# Patient Record
Sex: Female | Born: 1980 | Race: Black or African American | Hispanic: No | Marital: Single | Smoking: Former smoker
Health system: Southern US, Community
[De-identification: ages and names within clinical notes are randomized; demographics above are authoritative.]

## PROBLEM LIST (undated history)

## (undated) DIAGNOSIS — Z803 Family history of malignant neoplasm of breast: Secondary | ICD-10-CM

## (undated) DIAGNOSIS — A64 Unspecified sexually transmitted disease: Secondary | ICD-10-CM

## (undated) DIAGNOSIS — Z5189 Encounter for other specified aftercare: Secondary | ICD-10-CM

## (undated) DIAGNOSIS — Z8481 Family history of carrier of genetic disease: Secondary | ICD-10-CM

## (undated) HISTORY — DX: Encounter for other specified aftercare: Z51.89

## (undated) HISTORY — DX: Family history of malignant neoplasm of breast: Z80.3

## (undated) HISTORY — DX: Family history of carrier of genetic disease: Z84.81

## (undated) HISTORY — DX: Unspecified sexually transmitted disease: A64

## (undated) HISTORY — PX: WRIST SURGERY: SHX841

---

## 2000-09-06 ENCOUNTER — Emergency Department (HOSPITAL_COMMUNITY): Admission: EM | Admit: 2000-09-06 | Discharge: 2000-09-06 | Payer: Self-pay | Admitting: Emergency Medicine

## 2000-09-26 ENCOUNTER — Emergency Department (HOSPITAL_COMMUNITY): Admission: EM | Admit: 2000-09-26 | Discharge: 2000-09-26 | Payer: Self-pay | Admitting: Emergency Medicine

## 2000-09-26 ENCOUNTER — Encounter: Payer: Self-pay | Admitting: Emergency Medicine

## 2004-11-20 ENCOUNTER — Emergency Department (HOSPITAL_COMMUNITY): Admission: EM | Admit: 2004-11-20 | Discharge: 2004-11-20 | Payer: Self-pay | Admitting: Emergency Medicine

## 2010-06-14 DIAGNOSIS — Z5189 Encounter for other specified aftercare: Secondary | ICD-10-CM

## 2010-06-14 HISTORY — DX: Encounter for other specified aftercare: Z51.89

## 2010-07-11 DIAGNOSIS — O1490 Unspecified pre-eclampsia, unspecified trimester: Secondary | ICD-10-CM

## 2013-05-30 ENCOUNTER — Emergency Department (HOSPITAL_COMMUNITY)
Admission: EM | Admit: 2013-05-30 | Discharge: 2013-05-30 | Disposition: A | Discharge status: Home / Self Care | Payer: Self-pay | Attending: Emergency Medicine | Admitting: Emergency Medicine

## 2013-05-30 ENCOUNTER — Encounter (HOSPITAL_COMMUNITY): Payer: Self-pay | Admitting: Emergency Medicine

## 2013-05-30 ENCOUNTER — Telehealth (HOSPITAL_COMMUNITY): Payer: Self-pay | Admitting: *Deleted

## 2013-05-30 DIAGNOSIS — T24219A Burn of second degree of unspecified thigh, initial encounter: Secondary | ICD-10-CM | POA: Insufficient documentation

## 2013-05-30 DIAGNOSIS — Y93G9 Activity, other involving cooking and grilling: Secondary | ICD-10-CM | POA: Insufficient documentation

## 2013-05-30 DIAGNOSIS — T2122XA Burn of second degree of abdominal wall, initial encounter: Secondary | ICD-10-CM | POA: Insufficient documentation

## 2013-05-30 DIAGNOSIS — F17200 Nicotine dependence, unspecified, uncomplicated: Secondary | ICD-10-CM | POA: Insufficient documentation

## 2013-05-30 DIAGNOSIS — X12XXXA Contact with other hot fluids, initial encounter: Secondary | ICD-10-CM | POA: Insufficient documentation

## 2013-05-30 DIAGNOSIS — X131XXA Other contact with steam and other hot vapors, initial encounter: Secondary | ICD-10-CM

## 2013-05-30 DIAGNOSIS — Z23 Encounter for immunization: Secondary | ICD-10-CM | POA: Insufficient documentation

## 2013-05-30 DIAGNOSIS — Y929 Unspecified place or not applicable: Secondary | ICD-10-CM | POA: Insufficient documentation

## 2013-05-30 DIAGNOSIS — IMO0002 Reserved for concepts with insufficient information to code with codable children: Secondary | ICD-10-CM

## 2013-05-30 MED ORDER — FENTANYL CITRATE 0.05 MG/ML IJ SOLN
100.0000 ug | Freq: Once | INTRAMUSCULAR | Status: AC
Start: 2013-05-30 — End: 2013-05-30
  Administered 2013-05-30: 100 ug via INTRAVENOUS

## 2013-05-30 MED ORDER — TETANUS-DIPHTH-ACELL PERTUSSIS 5-2.5-18.5 LF-MCG/0.5 IM SUSP
0.5000 mL | Freq: Once | INTRAMUSCULAR | Status: AC
  Administered 2013-05-30: 0.5 mL via INTRAMUSCULAR

## 2013-05-30 MED ORDER — FENTANYL CITRATE 0.05 MG/ML IJ SOLN
INTRAMUSCULAR | Status: AC
  Filled 2013-05-30: qty 2

## 2013-05-30 MED ORDER — SILVER SULFADIAZINE 1 % EX CREA: 1.0000 "application " | TOPICAL_CREAM | Freq: Every day | CUTANEOUS | Status: DC

## 2013-05-30 MED ORDER — SILVER SULFADIAZINE 1 % EX CREA
TOPICAL_CREAM | Freq: Once | CUTANEOUS | Status: AC
  Administered 2013-05-30: 21:00:00 via TOPICAL
  Filled 2013-05-30: qty 85

## 2013-05-30 MED ORDER — TETANUS-DIPHTH-ACELL PERTUSSIS 5-2.5-18.5 LF-MCG/0.5 IM SUSP
INTRAMUSCULAR | Status: AC
  Filled 2013-05-30: qty 0.5

## 2013-05-30 MED ORDER — OXYCODONE-ACETAMINOPHEN 10-325 MG PO TABS: 1.0000 | ORAL_TABLET | ORAL | Status: DC | PRN

## 2013-05-30 MED ORDER — FENTANYL CITRATE 0.05 MG/ML IJ SOLN
INTRAMUSCULAR | Status: AC
  Administered 2013-05-30: 100 ug via INTRAVENOUS
  Filled 2013-05-30: qty 2
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
## 2013-05-30 NOTE — ED Notes (Signed)
pt calling wanting to know if we would allow here to get the pain meds without the silvidine cream at this time because she has a tube of silvidine cream we gave her with her visit.  I told her to have the pharmacy call me directly.

## 2013-05-30 NOTE — Discharge Instructions (Signed)

## 2013-06-05 ENCOUNTER — Encounter (HOSPITAL_COMMUNITY): Payer: Self-pay | Admitting: Emergency Medicine

## 2013-06-05 ENCOUNTER — Emergency Department (HOSPITAL_COMMUNITY)
Admission: EM | Admit: 2013-06-05 | Discharge: 2013-06-05 | Disposition: A | Discharge status: Home / Self Care | Payer: Self-pay | Attending: Emergency Medicine | Admitting: Emergency Medicine

## 2013-06-05 DIAGNOSIS — Z792 Long term (current) use of antibiotics: Secondary | ICD-10-CM | POA: Insufficient documentation

## 2013-06-05 DIAGNOSIS — T2122XA Burn of second degree of abdominal wall, initial encounter: Secondary | ICD-10-CM | POA: Insufficient documentation

## 2013-06-05 DIAGNOSIS — N61 Mastitis without abscess: Secondary | ICD-10-CM | POA: Insufficient documentation

## 2013-06-05 DIAGNOSIS — X131XXA Other contact with steam and other hot vapors, initial encounter: Secondary | ICD-10-CM

## 2013-06-05 DIAGNOSIS — X12XXXA Contact with other hot fluids, initial encounter: Secondary | ICD-10-CM | POA: Insufficient documentation

## 2013-06-05 DIAGNOSIS — Z975 Presence of (intrauterine) contraceptive device: Secondary | ICD-10-CM | POA: Insufficient documentation

## 2013-06-05 DIAGNOSIS — T2101XA Burn of unspecified degree of chest wall, initial encounter: Secondary | ICD-10-CM | POA: Insufficient documentation

## 2013-06-05 DIAGNOSIS — Y929 Unspecified place or not applicable: Secondary | ICD-10-CM | POA: Insufficient documentation

## 2013-06-05 DIAGNOSIS — Y9389 Activity, other specified: Secondary | ICD-10-CM | POA: Insufficient documentation

## 2013-06-05 DIAGNOSIS — T24219A Burn of second degree of unspecified thigh, initial encounter: Secondary | ICD-10-CM | POA: Insufficient documentation

## 2013-06-05 DIAGNOSIS — F17200 Nicotine dependence, unspecified, uncomplicated: Secondary | ICD-10-CM | POA: Insufficient documentation

## 2013-06-05 DIAGNOSIS — Z5189 Encounter for other specified aftercare: Secondary | ICD-10-CM

## 2013-06-05 MED ORDER — SULFAMETHOXAZOLE-TRIMETHOPRIM 800-160 MG PO TABS: 1.0000 | ORAL_TABLET | Freq: Two times a day (BID) | ORAL | Status: DC

## 2013-06-05 NOTE — Progress Notes (Signed)
                  Dear __________ Marisa HerbLetraea Williams _________________:  Marisa QuinYou have been approved to have your discharge prescriptions filled through our [facility] ([facility]) program. This program allows for a one-time (no refills) 34-day supply of selected medications for a low copay amount.  The copay is $3.00 per prescription. For instance, if you have one prescription, you will pay $3.00; for two prescriptions, you pay $6.00; for three prescriptions, you pay $9.00; and so on.  Only certain pharmacies are participating in this program with [facility]. You will need to select one of the pharmacies from the attached list and take your prescriptions, this letter, and your photo ID to one of the participating pharmacies.   We are excited that you are able to use the [facility] program to get your medications. These prescriptions must be filled within 7 days of hospital discharge or they will no longer be valid for the [facility] program. Should you have any problems with your prescriptions please contact your case management team member at [phone].  Thank you,   [facility]   Participating [facility] Pharmacies  [facility]   [facility] [address] [place], [place]   [facility] [address] [place], [place]   [facility] [address] [place], [place]   [facility]   [address], [place], [place]   [id number], [place], [place]   [address], [place], [place]   [id number] [address], [place], [place]   [id number], [place], [place]   [id number] [address], [place], [place]   [address], [place], [place]   [address], [place], [place]   [address], [place], [place]   [id number], [place], [place]  [facility]   [address], [place], [place]   [id number] [place],  [place]   [id number], [place], [place]   [id number] [address], [place], [place]   [address], [place], [place]   [id number] [place] [address], [place], [place] [facility]   [address], [place], [place]   [address], [place], [place]   [id number] [address], [place], [place]   [address], [place], [place]   [address], [place], [place]   [address], [place], [place]   [address], [place], [place]   [address], [place], [place]   [id number] [address], [place], [place]   [address], [place], [place]   [address], [place], [place]   [address], [place], [place]   [address], [place], [place]   [id number] [place], [place], [place]  Independent Pharmacies   [facility] [address], [address] [place], [place]   [facility] [address] [place], [place]   [facility] [address] [place], [place]   For continued medication needs, please contact the [facility] at  [phone].
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
## 2013-06-05 NOTE — ED Provider Notes (Signed)
Medical screening examination/treatment/procedure(s) were performed by non-physician practitioner and as supervising physician I was immediately available for consultation/collaboration.  EKG Interpretation   None         Glynn OctaveStephen Elzia Hott, MD 06/05/13 [phone]

## 2013-06-05 NOTE — Discharge Instructions (Signed)
Continue applying silvadene cream and changing dressings daily. Take prescribed antibiotics as directed. Return to the ED for new or worsening symptoms-- high fever, nipple discharge, abscess formation, etc.
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
## 2013-08-24 ENCOUNTER — Emergency Department (INDEPENDENT_AMBULATORY_CARE_PROVIDER_SITE_OTHER)
Admission: EM | Admit: 2013-08-24 | Discharge: 2013-08-24 | Disposition: A | Discharge status: Home / Self Care | Payer: Self-pay | Source: Home / Self Care | Attending: Emergency Medicine | Admitting: Emergency Medicine

## 2013-08-24 ENCOUNTER — Encounter (HOSPITAL_COMMUNITY): Payer: Self-pay | Admitting: Emergency Medicine

## 2013-08-24 DIAGNOSIS — J329 Chronic sinusitis, unspecified: Secondary | ICD-10-CM

## 2013-08-24 DIAGNOSIS — J029 Acute pharyngitis, unspecified: Secondary | ICD-10-CM

## 2013-08-24 DIAGNOSIS — R0982 Postnasal drip: Secondary | ICD-10-CM

## 2013-08-24 LAB — POCT RAPID STREP A: STREPTOCOCCUS, GROUP A SCREEN (DIRECT): NEGATIVE

## 2013-08-24 MED ORDER — PREDNISONE 10 MG PO TABS: ORAL_TABLET | ORAL | Status: DC

## 2013-08-24 MED ORDER — FLUTICASONE PROPIONATE 50 MCG/ACT NA SUSP: 2.0000 | Freq: Two times a day (BID) | NASAL | Status: DC

## 2013-08-24 NOTE — ED Provider Notes (Signed)
CSN: [id number]     Arrival date & time 08/24/13  0809 History   First MD Initiated Contact with Patient 08/24/13 0825     Chief Complaint  Patient presents with  . Sore Throat   (Consider location/radiation/quality/duration/timing/severity/associated sxs/prior Treatment) HPI Comments: 89f presents c/o sore throat for a week, also some sinus headache and ear pressure.  She has tried multiple OTCs with minimal relief but problem not resolving.  No fever, chills, NVD, cough, SOB.  Hx of "sinus allergies"   Patient is a 33 y.o. female presenting with pharyngitis.  Sore Throat    History reviewed. No pertinent past medical history. Past Surgical History  Procedure Laterality Date  . Cesarean section     History reviewed. No pertinent family history. History  Substance Use Topics  . Smoking status: Current Every Day Smoker -- 0.25 packs/day    Types: Cigarettes  . Smokeless tobacco: Not on file  . Alcohol Use: Yes   OB History   Grav Para Term Preterm Abortions TAB SAB Ect Mult Living                 Review of Systems  HENT: Positive for congestion, ear pain (pressure) and sore throat.   All other systems reviewed and are negative.   Allergies  Review of patient's allergies indicates no known allergies.  Home Medications   Current Outpatient Rx  Name  Route  Sig  Dispense  Refill  . fluticasone (FLONASE) 50 MCG/ACT nasal spray   Each Nare   Place 2 sprays into both nostrils 2 (two) times daily. Decrease to 2 sprays/nostril daily after 5 days   16 g   2   . levonorgestrel (MIRENA) 20 MCG/24HR IUD   Intrauterine   1 each by Intrauterine route once. Implanted April 2012         . neomycin-bacitracin-polymyxin (NEOSPORIN) OINT   Topical   Apply 1 application topically as needed for wound care.         Marland Kitchen. oxyCODONE-acetaminophen (PERCOCET) 10-325 MG per tablet   Oral   Take 1 tablet by mouth every 4 (four) hours as needed for pain.   30 tablet   0   .  predniSONE (DELTASONE) 10 MG tablet      4 tabs PO QD for 4 days;  3 tabs PO QD for 3 days;  2 tabs PO QD for 2 days;  1 tab PO QD for 1 day   30 tablet   0   . silver sulfADIAZINE (SILVADENE) 1 % cream   Topical   Apply 1 application topically daily.   50 g   0   . sulfamethoxazole-trimethoprim (SEPTRA DS) 800-160 MG per tablet   Oral   Take 1 tablet by mouth every 12 (twelve) hours.   20 tablet   0    BP 126/78  Pulse 76  Temp(Src) 98.1 F (36.7 C) (Oral)  Resp 16  SpO2 100%  LMP 08/14/2013 Physical Exam  Nursing note and vitals reviewed. Constitutional: She is oriented to person, place, and time. Vital signs are normal. She appears well-developed and well-nourished. No distress.  HENT:  Head: Normocephalic and atraumatic.  Right Ear: External ear normal.  Left Ear: External ear normal.  Nose: Nose normal.  Mouth/Throat: Posterior oropharyngeal erythema (mild) present. No oropharyngeal exudate.  Cobblestoning posterior oropharynx  Eyes: Conjunctivae are normal. Right eye exhibits no discharge. Left eye exhibits no discharge.  Neck: Normal range of motion. Neck supple.  Cardiovascular: Normal  rate, regular rhythm and normal heart sounds.   Pulmonary/Chest: Effort normal and breath sounds normal. No respiratory distress.  Lymphadenopathy:    She has no cervical adenopathy.  Neurological: She is alert and oriented to person, place, and time. She has normal strength. Coordination normal.  Skin: Skin is warm and dry. No rash noted. She is not diaphoretic.  Psychiatric: She has a normal mood and affect. Judgment normal.    ED Course  Procedures (including critical care time) Labs Review Labs Reviewed  POCT RAPID STREP A ([facility] URG CARE ONLY)   Imaging Review No results found.   MDM   1. Post-nasal drainage   2. Pharyngitis     Sore throat most likely being caused by post nasal drip.  Treat for allergies and allergic sinusitis, should resolve the problem.  F/u  PRN   Meds ordered this encounter  Medications  . fluticasone (FLONASE) 50 MCG/ACT nasal spray    Sig: Place 2 sprays into both nostrils 2 (two) times daily. Decrease to 2 sprays/nostril daily after 5 days    Dispense:  16 g    Refill:  2    Order Specific Question:  Supervising Provider    Answer:  Lorenz CoasterKELLER, DAVID C [id number]]  . predniSONE (DELTASONE) 10 MG tablet    Sig: 4 tabs PO QD for 4 days;  3 tabs PO QD for 3 days;  2 tabs PO QD for 2 days;  1 tab PO QD for 1 day    Dispense:  30 tablet    Refill:  0    Order Specific Question:  Supervising Provider    Answer:  Lorenz CoasterKELLER, DAVID C [[id number]]   Take advil allergy sinus as well     Graylon GoodZachary H Keatin Benham, PA-C 08/24/13 0848

## 2013-08-24 NOTE — ED Notes (Signed)
C/o  Sore throat x 1 wk.  Pain with swallowing. Mild swelling.  Gradually getting worse.  Symptoms worse at night.  Denies fever, n/v/d.  Mild relief with otc meds.

## 2013-08-24 NOTE — ED Provider Notes (Signed)
Medical screening examination/treatment/procedure(s) were performed by non-physician practitioner and as supervising physician I was immediately available for consultation/collaboration.  Leslee Homeavid Heer Justiss, M.D.  Reuben Likesavid C Tequisha Maahs, MD 08/24/13 [phone]

## 2013-08-24 NOTE — Discharge Instructions (Signed)
Pharyngitis °Pharyngitis is redness, pain, and swelling (inflammation) of your pharynx.  °CAUSES  °Pharyngitis is usually caused by infection. Most of the time, these infections are from viruses (viral) and are part of a cold. However, sometimes pharyngitis is caused by bacteria (bacterial). Pharyngitis can also be caused by allergies. Viral pharyngitis may be spread from person to person by coughing, sneezing, and personal items or utensils (cups, forks, spoons, toothbrushes). Bacterial pharyngitis may be spread from person to person by more intimate contact, such as kissing.  °SIGNS AND SYMPTOMS  °Symptoms of pharyngitis include:   °· Sore throat.   °· Tiredness (fatigue).   °· Low-grade fever.   °· Headache. °· Joint pain and muscle aches. °· Skin rashes. °· Swollen lymph nodes. °· Plaque-like film on throat or tonsils (often seen with bacterial pharyngitis). °DIAGNOSIS  °Your health care provider will ask you questions about your illness and your symptoms. Your medical history, along with a physical exam, is often all that is needed to diagnose pharyngitis. Sometimes, a rapid strep test is done. Other lab tests may also be done, depending on the suspected cause.  °TREATMENT  °Viral pharyngitis will usually get better in 3 4 days without the use of medicine. Bacterial pharyngitis is treated with medicines that kill germs (antibiotics).  °HOME CARE INSTRUCTIONS  °· Drink enough water and fluids to keep your urine clear or pale yellow.   °· Only take over-the-counter or prescription medicines as directed by your health care provider:   °· If you are prescribed antibiotics, make sure you finish them even if you start to feel better.   °· Do not take aspirin.   °· Get lots of rest.   °· Gargle with 8 oz of salt water (½ tsp of salt per 1 qt of water) as often as every 1 2 hours to soothe your throat.   °· Throat lozenges (if you are not at risk for choking) or sprays may be used to soothe your throat. °SEEK MEDICAL  CARE IF:  °· You have large, tender lumps in your neck. °· You have a rash. °· You cough up green, yellow-brown, or bloody spit. °SEEK IMMEDIATE MEDICAL CARE IF:  °· Your neck becomes stiff. °· You drool or are unable to swallow liquids. °· You vomit or are unable to keep medicines or liquids down. °· You have severe pain that does not go away with the use of recommended medicines. °· You have trouble breathing (not caused by a stuffy nose). °MAKE SURE YOU:  °· Understand these instructions. °· Will watch your condition. °· Will get help right away if you are not doing well or get worse. °Document Released: 04/30/2005 Document Revised: 02/18/2013 Document Reviewed: 01/05/2013 °ExitCare® Patient Information ©2014 [employer]. ° °

## 2013-08-26 LAB — CULTURE, GROUP A STREP

## 2013-12-01 ENCOUNTER — Emergency Department (HOSPITAL_COMMUNITY)
Admission: EM | Admit: 2013-12-01 | Discharge: 2013-12-01 | Disposition: A | Discharge status: Home / Self Care | Payer: Self-pay | Attending: Emergency Medicine | Admitting: Emergency Medicine

## 2013-12-01 ENCOUNTER — Encounter (HOSPITAL_COMMUNITY): Payer: Self-pay | Admitting: Emergency Medicine

## 2013-12-01 DIAGNOSIS — H00019 Hordeolum externum unspecified eye, unspecified eyelid: Secondary | ICD-10-CM | POA: Insufficient documentation

## 2013-12-01 DIAGNOSIS — H00016 Hordeolum externum left eye, unspecified eyelid: Secondary | ICD-10-CM

## 2013-12-01 DIAGNOSIS — F17200 Nicotine dependence, unspecified, uncomplicated: Secondary | ICD-10-CM | POA: Insufficient documentation

## 2013-12-01 DIAGNOSIS — Z79899 Other long term (current) drug therapy: Secondary | ICD-10-CM | POA: Insufficient documentation

## 2013-12-01 MED ORDER — ERYTHROMYCIN 5 MG/GM OP OINT: TOPICAL_OINTMENT | OPHTHALMIC | Status: DC

## 2013-12-01 MED ORDER — FLUORESCEIN SODIUM 1 MG OP STRP
1.0000 | ORAL_STRIP | Freq: Once | OPHTHALMIC | Status: AC
  Administered 2013-12-01: 1 via OPHTHALMIC
  Filled 2013-12-01: qty 1

## 2013-12-01 MED ORDER — ERYTHROMYCIN 5 MG/GM OP OINT
TOPICAL_OINTMENT | Freq: Once | OPHTHALMIC | Status: AC
  Administered 2013-12-01: 1 via OPHTHALMIC
  Filled 2013-12-01: qty 3.5

## 2013-12-01 MED ORDER — TETRACAINE HCL 0.5 % OP SOLN
2.0000 [drp] | Freq: Once | OPHTHALMIC | Status: AC
  Administered 2013-12-01: 2 [drp] via OPHTHALMIC
  Filled 2013-12-01: qty 2
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
## 2013-12-01 NOTE — Discharge Instructions (Signed)
Please call your doctor for a followup appointment within 24-48 hours. When you talk to your doctor please let them know that you were seen in the emergency department and have them acquire all of your records so that they can discuss the findings with you and formulate a treatment plan to fully care for your new and ongoing problems. Please call and set-up an appointment with Eye doctor Please rest and stay hydrated Please use antibiotics ointment as prescribed Please apply warm compressions to the eye and massage If symptoms do not improve within 2 days please report back to the ED Please continue to monitor symptoms closely if symptoms are to worsen or change (fever greater 101, chills, chest pain, shortness of breath, difficulty breathing, swelling to the eye, redness to the eye, drainage, discharge, blurred vision, sudden loss of vision, worsening or changes to pain symptoms) please report back to the ED immediately    Sty A sty (hordeolum) is an infection of a gland in the eyelid located at the base of the eyelash. A sty may develop a white or yellow head of pus. It can be puffy (swollen). Usually, the sty will burst and pus will come out on its own. They do not leave lumps in the eyelid once they drain. A sty is often confused with another form of cyst of the eyelid called a chalazion. Chalazions occur within the eyelid and not on the edge where the bases of the eyelashes are. They often are red, sore and then form firm lumps in the eyelid. CAUSES   Germs (bacteria).  Lasting (chronic) eyelid inflammation. SYMPTOMS   Tenderness, redness and swelling along the edge of the eyelid at the base of the eyelashes.  Sometimes, there is a white or yellow head of pus. It may or may not drain. DIAGNOSIS  An ophthalmologist will be able to distinguish between a sty and a chalazion and treat the condition appropriately.  TREATMENT   Styes are typically treated with warm packs (compresses) until  drainage occurs.  In rare cases, medicines that kill germs (antibiotics) may be prescribed. These antibiotics may be in the form of drops, cream or pills.  If a hard lump has formed, it is generally necessary to do a small incision and remove the hardened contents of the cyst in a minor surgical procedure done in the office.  In suspicious cases, your caregiver may send the contents of the cyst to the lab to be certain that it is not a rare, but dangerous form of cancer of the glands of the eyelid. HOME CARE INSTRUCTIONS   Wash your hands often and dry them with a clean towel. Avoid touching your eyelid. This may spread the infection to other parts of the eye.  Apply heat to your eyelid for 10 to 20 minutes, several times a day, to ease pain and help to heal it faster.  Do not squeeze the sty. Allow it to drain on its own. Wash your eyelid carefully 3 to 4 times per day to remove any pus. SEEK IMMEDIATE MEDICAL CARE IF:   Your eye becomes painful or puffy (swollen).  Your vision changes.  Your sty does not drain by itself within 3 days.  Your sty comes back within a short period of time, even with treatment.  You have redness (inflammation) around the eye.  You have a fever. Document Released: 02/07/2005 Document Revised: 07/23/2011 Document Reviewed: 10/12/2008 [facility] Patient Information 2015 [place], [place]. This information is not intended to replace  advice given to you by your health care provider. Make sure you discuss any questions you have with your health care provider.   Emergency Department Resource Guide 1) Find a Doctor and Pay Out of Pocket Although you won't have to find out who is covered by your insurance plan, it is a good idea to ask around and get recommendations. You will then need to call the office and see if the doctor you have chosen will accept you as a new patient and what types of options they offer for patients who are self-pay. Some doctors offer  discounts or will set up payment plans for their patients who do not have insurance, but you will need to ask so you aren't surprised when you get to your appointment.  2) Contact Your Local Health Department Not all health departments have doctors that can see patients for sick visits, but many do, so it is worth a call to see if yours does. If you don't know where your local health department is, you can check in your phone book. The CDC also has a tool to help you locate your state's health department, and many state websites also have listings of all of their local health departments.  3) Find a Walk-in Clinic If your illness is not likely to be very severe or complicated, you may want to try a walk in clinic. These are popping up all over the country in pharmacies, drugstores, and shopping centers. They're usually staffed by nurse practitioners or physician assistants that have been trained to treat common illnesses and complaints. They're usually fairly quick and inexpensive. However, if you have serious medical issues or chronic medical problems, these are probably not your best option.  No Primary Care Doctor: - Call Health Connect at  [phone] - they can help you locate a primary care doctor that  accepts your insurance, provides certain services, etc. - Physician Referral Service- [phone]  Chronic Pain Problems: Organization         Address  Phone   Notes  [facility]  [phone] Patients need to be referred by their primary care doctor.   Medication Assistance: Organization         Address  Phone   Notes  [employer] Medication [facility] [address] [place] [place], [place] [address] [phone] --Must be a resident of [facility] -- Must have NO insurance coverage whatsoever (no Medicaid/ Medicare, etc.) -- The pt. MUST have a primary care doctor that directs their care regularly and follows them in the community   [facility]ist   [phone]   [employer]  [phone]    Agencies that provide inexpensive medical care: Organization         Address  Phone   Notes  [facility]  [phone]   [facility]    [phone]   [facility] [address] [place], [place] [address] [phone]   [facility] [address] [place], [place] [phone]   Planned Parenthood    [phone]   [facility]    [phone]   [facility]  [address], [place] Phone:  [phone], Fax:  [phone] Hours of Operation:  9 am - 6 pm, M-F.  Also accepts Medicaid/Medicare and self-pay.  [facility]  [address], [place] Phone: [phone])  [phone], Fax: [phone](336) [phone]. Hours of Operation:  8:30 am - 5:30 pm, M-F.  Also accepts Medicaid and self-pay.  [facility] [place] [address], [place] Phone: [phone](336) [phone]   [facility] [address], [place], [place] [phone], Ext. [phone] Mondays & Thursdays: 7-9 AM.  First 15 patients are seen on a first come, first serve basis.    Medicaid-accepting [facility] Providers:  Organization         Address  Phone   Notes  [facility] [address], [place] [phone]) [phone] Also accepts self-pay patients.  [facility] [address], [place]  [phone]) [phone]   [facility] [address], [place] [phone]) [phone]   [facility] [address] [place] [address], [place] [phone]) [phone]   Renaye RakersVeita Bland [address], [place]   [phone] Only accepts [place] Access [place] patients after they have their name applied to their card.   Self-Pay (no insurance) in [facility]:  Organization         Address  Phone   Notes  Sickle Cell Patients, [facility] [address], [place] [phone]   [facility] [address], [place] [phone]) [phone]   [facility]  [address], [place] [phone]) [phone]   [facility]/Dr. Osei-Bonsu  [address] [place] [address], [place] or [phone], [place] [phone]) [phone] Phone number for both [place] and [place] locations is the same.  [facility] [address], [place] [phone]) [phone]   [facility] [address] [place] [address], [place] or [address] [phone]) [phone]   [facility] [address], [place] [phone]) [phone], phone; [phone]) [phone], fax Sees patients 1st and 3rd Saturday of every month.  Must not qualify for public or private insurance (i.e. Medicaid, Medicare, [place] Health Choice, Veterans' Benefits)  Household income should be no more than 200% of the poverty level The clinic cannot treat you if you are pregnant or think you are pregnant  Sexually transmitted diseases are not treated at the clinic.    Dental Care: Organization         Address  Phone  Notes  [facility] [facility] [address], [place] [phone]) [phone] Accepts children up to age 33 who are enrolled in [place] or [place] Health Choice; pregnant women with a Medicaid card; and children who have applied for Medicaid or [place] Health Choice, but were declined, whose parents can pay a reduced fee at time of service.  [facility] of [facility] [place]  [address], [place] [phone]) [phone] Accepts children up to age 33 who are enrolled in [place] or [place] Health Choice; pregnant women with a Medicaid card; and children who have applied for Medicaid or [place] Health Choice, but were declined, whose parents can pay a reduced fee at time of service.  [facility]  [address], [place]  [phone](336) [phone] Patients are seen by appointment only. Walk-ins are not accepted. [facility] will see patients 33 years of age and older. Monday - Tuesday (8am-5pm) Most Wednesdays (8:30-5pm) $30 per visit, cash only  [facility]  [address], [place] [phone] Patients are seen by appointment only. Walk-ins are not accepted. [facility] will see patients 39 years of age and older. One Wednesday Evening (Monthly: Volunteer Based).  $30 per visit, cash only  [employer]  [phone] for adults; Children under age 54, call Graduate Pediatric Dentistry at [phone]. Children aged 59-14, please call [phone] to request a pediatric application.  Dental services are provided in all areas of dental care including fillings, crowns and bridges, complete and partial dentures, implants, gum treatment, root canals, and extractions. Preventive care is also provided. Treatment is provided to both adults and children. Patients are selected via a lottery and there is often a waiting list.   [facility] [address], [place]  [phone] [email or web address]   [facility] [address] [place], [place] [phone], Ext. [phone] Second and Fourth Thursday of each month, opens at 6:30 AM; Clinic ends at 9 AM.  Patients are seen on a first-come first-served basis, and a limited number are seen during each clinic.   [facility]  [address] [place], [place] [phone]   Eligibility Requirements You must have lived in [place], [place], or [place] counties for at least the last three months.   You cannot be eligible for state or federal sponsored [place], including [employer], [place], or [employer].   You generally cannot be eligible for healthcare insurance through your employer.    How to apply: Eligibility screenings are held every Tuesday and Wednesday  afternoon from 1:00 pm until 4:00 pm. You do not need an appointment for the interview!  [facility] [address] [place], [place] [phone]   [facility] Health Department  [phone]   [facility] Health Department  [phone]   [facility] Health Department  [phone]    Behavioral Health Resources in the Community: Intensive Outpatient Programs Organization         Address  Phone  Notes  [facility] Services [address], [place], [place] [phone]   [facility] Outpatient [address] [place], [place] [phone]   [facility] [address] [place], [place]  [phone]   [facility] Mental [facility] [address],  [place], [place] [phone] or [phone]   Substance Abuse Resources Organization         Address  Phone  Notes  [facility]  [phone]   [facility]  [phone]   [facility]  [phone]   Floydene Flock  [phone]   Residential & Outpatient Substance Abuse Program  [phone]   Psychological Services Organization         Address  Phone  Notes  [facility] Behavioral Health  [phone]   [facility]  [phone]   [facility] Mental Health [address] [place] [phone] or [phone]    Mobile Crisis Teams Organization         Address  Phone  Notes  [facility], Mobile Crisis Care Unit  [phone]   [facility]  [address] [place], [place] [phone]   Doristine Locks [address] [place] [phone]    Self-Help/Support Groups Organization         Address  Phone             Notes  [facility] [place] - variety of support groups  [phone]- [phone] Call for more information  Narcotics Anonymous (NA), [facility] [address], [employer] [place]  2 meetings at this location   Residential IT trainer         Address  Phone  Notes  [facility] [address],    [place]  [phone]   [facility]  [address], [place] [address], [place], [place] [phone]   [facility] [address] [place], [place] [phone] Admissions: 8am-3pm M-F  [facility] [address]    [place], [place] [phone]   [facility] [address] [place], [place], [place] [phone]   [facility] [address]  [place], [place] [phone]   [facility] - Intensive Outpatient [address], [place], [place] [phone]   [facility] ([facility] [address] [place]  [place], [place] [phone] or [phone]   [facility] ([facility]) [address], [place], [place] [phone] Accepts Medicaid  [facility] [address]  [place] [phone] Substance Abuse/Addiction Treatment   [facility] Organization         Address  Phone  Notes  [facility]  [phone]   Angie Fava, PhD [address] [place], [place]   [phone] or [phone]   [facility] Behavioral   [address] [place], [place] [phone]   [facility] [address] [place], [place] [phone] Insurance/Medicaid/sponsorship through [facility] [address]                                    [place], [place] [phone] Therapy/tele-psych/case  [facility] [address], [place] [phone]    Dr. Lolly Mustache  [phone]   [facility]  [employer] [place] [facility]. 1) [address] [place] 2) [address] [place] 3)  [address], [place] [phone]  [phone]   [facility] Child Abuse Hotline [phone] or [phone] (After Hours)
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
## 2013-12-01 NOTE — Discharge Planning (Signed)
[facility] Community Liaison   Spoke to the patient regarding primary care resources and [facility]. Pt was given the orange card application and instructed to contact this liaison to further go over the application. Resource guide and my contact information also provided for any future questions or concerns. No other Community Liaison needs identified at this time.

## 2013-12-02 NOTE — ED Provider Notes (Signed)
Medical screening examination/treatment/procedure(s) were performed by non-physician practitioner and as supervising physician I was immediately available for consultation/collaboration.   EKG Interpretation None      Devoria AlbeIva Marietta Sikkema, MD, Armando GangFACEP   Ward GivensIva L Fielding Mault, MD 12/02/13 [phone]

## 2013-12-11 ENCOUNTER — Ambulatory Visit: Payer: Self-pay | Admitting: Internal Medicine

## 2013-12-16 ENCOUNTER — Ambulatory Visit: Payer: Self-pay | Admitting: Internal Medicine

## 2014-01-04 ENCOUNTER — Ambulatory Visit: Payer: Self-pay

## 2014-01-09 ENCOUNTER — Encounter (HOSPITAL_COMMUNITY): Payer: Self-pay | Admitting: Emergency Medicine

## 2014-01-09 ENCOUNTER — Emergency Department (HOSPITAL_COMMUNITY)
Admission: EM | Admit: 2014-01-09 | Discharge: 2014-01-09 | Disposition: A | Discharge status: Home / Self Care | Payer: Self-pay | Attending: Emergency Medicine | Admitting: Emergency Medicine

## 2014-01-09 DIAGNOSIS — R112 Nausea with vomiting, unspecified: Secondary | ICD-10-CM | POA: Insufficient documentation

## 2014-01-09 DIAGNOSIS — N390 Urinary tract infection, site not specified: Secondary | ICD-10-CM | POA: Insufficient documentation

## 2014-01-09 DIAGNOSIS — Z3202 Encounter for pregnancy test, result negative: Secondary | ICD-10-CM | POA: Insufficient documentation

## 2014-01-09 DIAGNOSIS — F17200 Nicotine dependence, unspecified, uncomplicated: Secondary | ICD-10-CM | POA: Insufficient documentation

## 2014-01-09 DIAGNOSIS — R197 Diarrhea, unspecified: Secondary | ICD-10-CM | POA: Insufficient documentation

## 2014-01-09 LAB — URINALYSIS, ROUTINE W REFLEX MICROSCOPIC
BILIRUBIN URINE: NEGATIVE
Glucose, UA: NEGATIVE mg/dL
Ketones, ur: NEGATIVE mg/dL
Nitrite: NEGATIVE
PH: 5.5 (ref 5.0–8.0)
Protein, ur: NEGATIVE mg/dL
Specific Gravity, Urine: 1.016 (ref 1.005–1.030)
Urobilinogen, UA: 0.2 mg/dL (ref 0.0–1.0)

## 2014-01-09 LAB — POC URINE PREG, ED: Preg Test, Ur: NEGATIVE

## 2014-01-09 LAB — URINE MICROSCOPIC-ADD ON

## 2014-01-09 MED ORDER — ONDANSETRON 4 MG PO TBDP
8.0000 mg | ORAL_TABLET | Freq: Once | ORAL | Status: AC
  Administered 2014-01-09: 8 mg via ORAL
  Filled 2014-01-09: qty 2

## 2014-01-09 MED ORDER — CIPROFLOXACIN HCL 500 MG PO TABS: 500.0000 mg | ORAL_TABLET | Freq: Two times a day (BID) | ORAL | Status: DC

## 2014-01-09 MED ORDER — ONDANSETRON 4 MG PO TBDP: 4.0000 mg | ORAL_TABLET | Freq: Three times a day (TID) | ORAL | Status: DC | PRN

## 2014-01-09 NOTE — ED Provider Notes (Signed)
Medical screening examination/treatment/procedure(s) were performed by non-physician practitioner and as supervising physician I was immediately available for consultation/collaboration.   EKG Interpretation None        Mirian Mo, MD 01/09/14 1421

## 2014-01-09 NOTE — ED Notes (Signed)
Pt reports eating a buffet yesterday then started to have n/v afterwards. She reports vomiting throughout the night. She tried to go to work today but she works around food and it was making the nausea worse so she came to ED. Reports pain with vomiting but not otherwise. Denies bowel/bladder changes. She had a normal BM this and and is currently having her normal menstrual cycle.

## 2014-01-09 NOTE — ED Provider Notes (Signed)
CSN: [id number]     Arrival date & time 01/09/14  0902 History   First MD Initiated Contact with Patient 01/09/14 0914     Chief Complaint  Patient presents with  . Emesis     (Consider location/radiation/quality/duration/timing/severity/associated sxs/prior Treatment) HPI Comments: Patient is a G75P1 33 yo F PMHx signfiicant for tobacco abuse presenting to the ED for acute onset nausea, nonbloody nonbilious emesis with one episode of nonbloody diarrhea around 4 AM this morning. Patient states she ate a buffet last evening at 6-8 hours prior to onset of symptoms. Patient endorses post emesis epigastric discomfort. No alleviating factors. Eating and drinking aggravate her symptoms. She is currently on her menstrual cycle. Abdominal surgical history includes 1 cesarean section.    History reviewed. No pertinent past medical history. Past Surgical History  Procedure Laterality Date  . Cesarean section     History reviewed. No pertinent family history. History  Substance Use Topics  . Smoking status: Current Every Day Smoker -- 0.25 packs/day    Types: Cigarettes  . Smokeless tobacco: Not on file  . Alcohol Use: Yes   OB History   Grav Para Term Preterm Abortions TAB SAB Ect Mult Living                 Review of Systems  Constitutional: Negative for fever.  Gastrointestinal: Positive for nausea, vomiting and diarrhea.  All other systems reviewed and are negative.     Allergies  Review of patient's allergies indicates no known allergies.  Home Medications   Prior to Admission medications   Medication Sig Start Date End Date Taking? Authorizing Provider  ciprofloxacin (CIPRO) 500 MG tablet Take 1 tablet (500 mg total) by mouth 2 (two) times daily. 01/09/14   Jasneet Schobert L Syna Gad, PA-C  erythromycin ophthalmic ointment Place a 1/2 inch ribbon of ointment into the lower eyelid three times per day for 5 days. 12/01/13   Marissa Sciacca, PA-C  ondansetron (ZOFRAN ODT) 4 MG  disintegrating tablet Take 1 tablet (4 mg total) by mouth every 8 (eight) hours as needed for nausea or vomiting. 01/09/14   Victorino Dike L Odalys Win, PA-C   BP 114/76  Pulse 69  Temp(Src) 98.2 F (36.8 C) (Oral)  Resp 16  Ht  (1.549 m)  Wt 148 lb (67.132 kg)  BMI 27.98 kg/m2  SpO2 100%  LMP 01/07/2014 Physical Exam  Nursing note and vitals reviewed. Constitutional: She is oriented to person, place, and time. She appears well-developed and well-nourished. No distress.  HENT:  Head: Normocephalic and atraumatic.  Right Ear: External ear normal.  Left Ear: External ear normal.  Nose: Nose normal.  Mouth/Throat: Oropharynx is clear and moist.  Eyes: Conjunctivae are normal.  Neck: Normal range of motion. Neck supple.  Cardiovascular: Normal rate, regular rhythm and normal heart sounds.   Pulmonary/Chest: Effort normal and breath sounds normal.  Abdominal: Soft. Bowel sounds are normal. She exhibits no distension. There is no tenderness. There is no rebound and no guarding.  Musculoskeletal: Normal range of motion.  Neurological: She is alert and oriented to person, place, and time.  Skin: Skin is warm and dry. She is not diaphoretic.  Psychiatric: She has a normal mood and affect.    ED Course  Procedures (including critical care time) Medications  ondansetron (ZOFRAN-ODT) disintegrating tablet 8 mg (8 mg Oral Given 01/09/14 0945)    Labs Review Labs Reviewed  URINALYSIS, ROUTINE W REFLEX MICROSCOPIC - Abnormal; Notable for the following:    APPearance  CLOUDY (*)    Hgb urine dipstick LARGE (*)    Leukocytes, UA SMALL (*)    All other components within normal limits  URINE MICROSCOPIC-ADD ON - Abnormal; Notable for the following:    Squamous Epithelial / LPF FEW (*)    Bacteria, UA FEW (*)    All other components within normal limits  POC URINE PREG, ED    Imaging Review No results found.   EKG Interpretation None      MDM   Final diagnoses:   Non-intractable vomiting with nausea, vomiting of unspecified type  UTI (lower urinary tract infection)    Filed Vitals:   01/09/14 1027  BP: 114/76  Pulse: 69  Temp:   Resp: 16   Afebrile, NAD, non-toxic appearing, AAOx4.   1) Nausea, vomiting: Patient with symptoms consistent with viral gastroenteritis/food poisoning.  Vitals are stable, no fever.  No signs of dehydration, tolerating PO fluids > 6 oz.  Lungs are clear.  No focal abdominal pain, no concern for appendicitis, cholecystitis, pancreatitis, ruptured viscus, UTI, kidney stone, or any other abdominal etiology.  Supportive therapy indicated with return if symptoms worsen.  Patient counseled.  2) UTI: Pt has been diagnosed with a UTI. Pt is afebrile, no CVA tenderness, normotensive, and denies N/V. Pt to be dc home with antibiotics and instructions to follow up with PCP if symptoms persist.  Patient is agreeable to plan. Patient is stable at time of discharge       Jeannetta Ellis, PA-C 01/09/14 1115

## 2014-01-09 NOTE — Discharge Instructions (Signed)
Please follow up with your primary care physician in 1-2 days. If you do not have one please call the [facility] and wellness Center number listed above. Please use Zofran as prescribed. Please read all discharge instructions and return precautions.    Nausea and Vomiting Nausea is a sick feeling that often comes before throwing up (vomiting). Vomiting is a reflex where stomach contents come out of your mouth. Vomiting can cause severe loss of body fluids (dehydration). Children and elderly adults can become dehydrated quickly, especially if they also have diarrhea. Nausea and vomiting are symptoms of a condition or disease. It is important to find the cause of your symptoms. CAUSES   Direct irritation of the stomach lining. This irritation can result from increased acid production (gastroesophageal reflux disease), infection, food poisoning, taking certain medicines (such as nonsteroidal anti-inflammatory drugs), alcohol use, or tobacco use.  Signals from the brain.These signals could be caused by a headache, heat exposure, an inner ear disturbance, increased pressure in the brain from injury, infection, a tumor, or a concussion, pain, emotional stimulus, or metabolic problems.  An obstruction in the gastrointestinal tract (bowel obstruction).  Illnesses such as diabetes, hepatitis, gallbladder problems, appendicitis, kidney problems, cancer, sepsis, atypical symptoms of a heart attack, or eating disorders.  Medical treatments such as chemotherapy and radiation.  Receiving medicine that makes you sleep (general anesthetic) during surgery. DIAGNOSIS Your caregiver may ask for tests to be done if the problems do not improve after a few days. Tests may also be done if symptoms are severe or if the reason for the nausea and vomiting is not clear. Tests may include:  Urine tests.  Blood tests.  Stool tests.  Cultures (to look for evidence of infection).  X-rays or other imaging  studies. Test results can help your caregiver make decisions about treatment or the need for additional tests. TREATMENT You need to stay well hydrated. Drink frequently but in small amounts.You may wish to drink water, sports drinks, clear broth, or eat frozen ice pops or gelatin dessert to help stay hydrated.When you eat, eating slowly may help prevent nausea.There are also some antinausea medicines that may help prevent nausea. HOME CARE INSTRUCTIONS   Take all medicine as directed by your caregiver.  If you do not have an appetite, do not force yourself to eat. However, you must continue to drink fluids.  If you have an appetite, eat a normal diet unless your caregiver tells you differently.  Eat a variety of complex carbohydrates (rice, wheat, potatoes, bread), lean meats, yogurt, fruits, and vegetables.  Avoid high-fat foods because they are more difficult to digest.  Drink enough water and fluids to keep your urine clear or pale yellow.  If you are dehydrated, ask your caregiver for specific rehydration instructions. Signs of dehydration may include:  Severe thirst.  Dry lips and mouth.  Dizziness.  Dark urine.  Decreasing urine frequency and amount.  Confusion.  Rapid breathing or pulse. SEEK IMMEDIATE MEDICAL CARE IF:   You have blood or brown flecks (like coffee grounds) in your vomit.  You have black or bloody stools.  You have a severe headache or stiff neck.  You are confused.  You have severe abdominal pain.  You have chest pain or trouble breathing.  You do not urinate at least once every 8 hours.  You develop cold or clammy skin.  You continue to vomit for longer than 24 to 48 hours.  You have a fever. MAKE SURE YOU:  Understand these instructions.  Will watch your condition.  Will get help right away if you are not doing well or get worse. Document Released: 04/30/2005 Document Revised: 07/23/2011 Document Reviewed:  09/27/2010 [facility] Patient Information 2015 [place], [place]. This information is not intended to replace advice given to you by your health care provider. Make sure you discuss any questions you have with your health care provider.

## 2014-12-31 ENCOUNTER — Encounter: Payer: Self-pay | Admitting: Certified Nurse Midwife

## 2015-01-04 ENCOUNTER — Encounter: Payer: Self-pay | Admitting: Nurse Practitioner

## 2015-01-04 ENCOUNTER — Ambulatory Visit (INDEPENDENT_AMBULATORY_CARE_PROVIDER_SITE_OTHER): Payer: PRIVATE HEALTH INSURANCE | Admitting: Nurse Practitioner

## 2015-01-04 VITALS — BP 112/66 | HR 64 | Ht 61.5 in | Wt 154.0 lb

## 2015-01-04 DIAGNOSIS — Z01419 Encounter for gynecological examination (general) (routine) without abnormal findings: Secondary | ICD-10-CM | POA: Diagnosis not present

## 2015-01-04 DIAGNOSIS — Z975 Presence of (intrauterine) contraceptive device: Secondary | ICD-10-CM

## 2015-01-04 DIAGNOSIS — Z0000 Encounter for general adult medical examination without abnormal findings: Secondary | ICD-10-CM | POA: Diagnosis not present

## 2015-01-04 DIAGNOSIS — R300 Dysuria: Secondary | ICD-10-CM | POA: Diagnosis not present

## 2015-01-04 LAB — POCT URINALYSIS DIPSTICK
BILIRUBIN UA: NEGATIVE
GLUCOSE UA: NEGATIVE
KETONES UA: NEGATIVE
Nitrite, UA: NEGATIVE
Protein, UA: NEGATIVE
RBC UA: NEGATIVE
Urobilinogen, UA: NEGATIVE
pH, UA: 7

## 2015-01-04 LAB — HEMOGLOBIN, FINGERSTICK: Hemoglobin, fingerstick: 12.8 g/dL (ref 12.0–16.0)

## 2015-01-04 NOTE — Patient Instructions (Signed)

## 2015-01-04 NOTE — Progress Notes (Signed)
12/11/14 Patient ID: Marisa Williams, female   DOB: 29-Jun-1980, 34 y.o.   MRN: [id number] 34 y.o. G1P0101 Single  African American Fe here for NGYN annual exam.  Same partner for 1.5 years - not the father of her child.  Last testing for STD's before this relationship. Menses last 5 days, moderate flow x 2 days then light.  Before IUD menses was 5 days with 2 days heavy and then moderate to light with cramps.   Wants to remove IUD for another pregnancy.  She had dysuria a week ago and treated herself with OTC AZO and symptoms improved.  She has gone to give blood several months ago and was told that she had an abnormal heart rate.  She was under a lot of stress at that time looking for a job.  Patient's last menstrual period was 12/11/2014 (exact date).          Sexually active: Yes.    The current method of family planning is IUD.  Mirena inserted 08/2010.  Would like to discuss removal. Exercising: No.  The patient does not participate in regular exercise at present. Smoker:  Yes, 1/2 pack per day   Health Maintenance: Pap:  2 years, normal, no history of abnormal TDaP:  05/30/13  Labs:  HB:  12.8  Urine:  1+ leuk's   reports that she has been smoking Cigarettes.  She has a 3.75 pack-year smoking history. She has never used smokeless tobacco. She reports that she drinks alcohol. She reports that she uses illicit drugs (Marijuana).  Past Medical History  Diagnosis Date  . Blood transfusion without reported diagnosis 06/2010    with pregnancy and C-Section  . STD (sexually transmitted disease) age 69    Gonorrhea    Past Surgical History  Procedure Laterality Date  . Cesarean section  07/11/2010    No current outpatient prescriptions on file.   No current facility-administered medications for this visit.    Family History  Problem Relation Age of Onset  . Diabetes Mother   . Breast cancer Mother 69    lumpectomy  . Aneurysm Mother     brain about age 26  . Stroke Mother   . Breast  cancer Maternal Aunt 53    chemo  . Stomach cancer Maternal Grandmother   . Colon cancer Paternal Grandfather   . Breast cancer Other     ROS:  Pertinent items are noted in HPI.  Otherwise, a comprehensive ROS was negative.  Exam:   BP 112/66 mmHg  Pulse 64  Ht 5' 1.5" (1.562 m)  Wt 154 lb (69.854 kg)  BMI 28.63 kg/m2  LMP 12/11/2014 (Exact Date) Height: 5' 1.5" (156.2 cm) Ht Readings from Last 3 Encounters:  01/04/15 5' 1.5" (1.562 m)  01/09/14 _0  (1.549 m)  06/05/13 5' 0.75" (1.543 m)    General appearance: alert, cooperative and appears stated age Head: Normocephalic, without obvious abnormality, atraumatic Neck: no adenopathy, supple, symmetrical, trachea midline and thyroid normal to inspection and palpation Lungs: clear to auscultation bilaterally Breasts: normal appearance, no masses or tenderness Heart: regular rate and rhythm, no atypia Abdomen: soft, non-tender; no masses,  no organomegaly Extremities: extremities normal, atraumatic, no cyanosis or edema Skin: Skin color, texture, turgor normal. No rashes or lesions Lymph nodes: Cervical, supraclavicular, and axillary nodes normal. No abnormal inguinal nodes palpated Neurologic: Grossly normal   Pelvic: External genitalia:  no lesions              Urethra:  normal appearing urethra with no masses, tenderness or lesions              Bartholin's and Skene's: normal                 Vagina: normal appearing vagina with normal color and discharge, no lesions              Cervix: anteverted  IUD strings are visible              Pap taken: Yes.   Bimanual Exam:  Uterus:  normal size, contour, position, consistency, mobility, non-tender              Adnexa: no mass, fullness, tenderness               Rectovaginal: Confirms               Anus:  normal sphincter tone, no lesions  Chaperone present: yes  A:  Well Woman with normal exam  Contraception  with Mirena IUD - wants removal for another pregnancy  First  pregnancy with prematurity at 27 wks.  [place]:   MA, MGA and Mother with breast cancer age 19  P:   Reviewed health and wellness pertinent to exam  Pap smear as above  Will follow with labs  Order placed for removal of IUD  Sh will start on Prenatal MVI ASAP  She requested a note saying that her heart rate was normal today  Information about BRCA testing and will see if mother or aunt has been tested.  Counseled on breast self exam, adequate intake of calcium and vitamin D, diet and exercise return annually or prn  An After Visit Summary was printed and given to the patient.

## 2015-01-05 LAB — STD PANEL
HIV 1&2 Ab, 4th Generation: NONREACTIVE
Hepatitis B Surface Ag: NEGATIVE

## 2015-01-05 LAB — URINE CULTURE
COLONY COUNT: NO GROWTH
ORGANISM ID, BACTERIA: NO GROWTH

## 2015-01-06 ENCOUNTER — Telehealth: Payer: Self-pay | Admitting: Nurse Practitioner

## 2015-01-06 LAB — IPS PAP TEST WITH HPV

## 2015-01-06 LAB — IPS N GONORRHOEA AND CHLAMYDIA BY PCR

## 2015-01-06 NOTE — Telephone Encounter (Signed)
Spoke with patient regarding iud removal benefit. Patient understands and agreeable. Patient agreeable to call from triage to schedule removal. Patty's note states removal toward end of the month with patients expected cycle.

## 2015-01-06 NOTE — Progress Notes (Signed)
Encounter reviewed by Dr. Brook Amundson C. Silva.  

## 2015-01-07 NOTE — Telephone Encounter (Signed)
Called patient. She is requests appointment for 01/11/15. Scheduled for IUD removal with Dr. Oscar La at 1330. Patient agreeable.Routing to provider for final review. Patient agreeable to disposition. Will close encounter.

## 2015-01-11 ENCOUNTER — Encounter: Payer: Self-pay | Admitting: Obstetrics & Gynecology

## 2015-01-11 ENCOUNTER — Ambulatory Visit: Payer: PRIVATE HEALTH INSURANCE | Admitting: Obstetrics and Gynecology

## 2015-01-11 ENCOUNTER — Telehealth: Payer: Self-pay | Admitting: Obstetrics and Gynecology

## 2015-01-11 ENCOUNTER — Ambulatory Visit (INDEPENDENT_AMBULATORY_CARE_PROVIDER_SITE_OTHER): Payer: PRIVATE HEALTH INSURANCE | Admitting: Obstetrics & Gynecology

## 2015-01-11 VITALS — BP 108/60 | HR 60 | Resp 16 | Wt 156.0 lb

## 2015-01-11 DIAGNOSIS — Z30432 Encounter for removal of intrauterine contraceptive device: Secondary | ICD-10-CM

## 2015-01-11 DIAGNOSIS — Z975 Presence of (intrauterine) contraceptive device: Secondary | ICD-10-CM

## 2015-01-11 NOTE — Progress Notes (Signed)
34 y.o. G62P0101 Single African American female presents for IUD removal.  Pt and significant other are ready to start trying for pregnancy.  Has not started PNV.  Advised I highly recommend this.     Delivered last child in [place], [place].  LMP:  Patient's last menstrual period was 01/07/2015.  Gen:  WNWF healthy female NAD  Pelvic exam: Vulva:  normal female genitalia Vagina:  normal vagina Cervix:  Non-tender, Negative CMT, no lesions or redness, IUD string noted Uterus:  normal shape, position and consistency    Procedure:  Speculum inserted into vagina. Cervix visualized and IUD string noted.  IUD removed with one pull.  Pt and I visualized IUD before discarding.  Speculum removed.  Pt tolerated procedure well.    A: removal of Mirena IUD  P: Pt to start PNV. Advised to call with +UPT and/or no pregnancy by 6-12 months.

## 2015-01-11 NOTE — Telephone Encounter (Signed)
Call to patient to change appointment for today from 1:30-3:30. Patient is going to check with work and call me back.

## 2015-01-11 NOTE — Telephone Encounter (Signed)
Patient returned call and is fine to move her appointment to 3:30. Appointment rescheduled as indicated.

## 2015-01-11 NOTE — Telephone Encounter (Signed)
Appointment change noted.   Routing to provider for final review. Patient agreeable to disposition. Will close encounter.

## 2015-05-09 ENCOUNTER — Emergency Department (HOSPITAL_COMMUNITY): Payer: Commercial Managed Care - PPO

## 2015-05-09 ENCOUNTER — Encounter (HOSPITAL_COMMUNITY): Payer: Self-pay | Admitting: Emergency Medicine

## 2015-05-09 ENCOUNTER — Emergency Department (HOSPITAL_COMMUNITY)
Admission: EM | Admit: 2015-05-09 | Discharge: 2015-05-09 | Disposition: A | Discharge status: Home / Self Care | Payer: Commercial Managed Care - PPO | Attending: Emergency Medicine | Admitting: Emergency Medicine

## 2015-05-09 DIAGNOSIS — S52592A Other fractures of lower end of left radius, initial encounter for closed fracture: Secondary | ICD-10-CM | POA: Diagnosis not present

## 2015-05-09 DIAGNOSIS — F17210 Nicotine dependence, cigarettes, uncomplicated: Secondary | ICD-10-CM | POA: Diagnosis not present

## 2015-05-09 DIAGNOSIS — W208XXA Other cause of strike by thrown, projected or falling object, initial encounter: Secondary | ICD-10-CM | POA: Insufficient documentation

## 2015-05-09 DIAGNOSIS — Y9389 Activity, other specified: Secondary | ICD-10-CM | POA: Diagnosis not present

## 2015-05-09 DIAGNOSIS — S52502A Unspecified fracture of the lower end of left radius, initial encounter for closed fracture: Secondary | ICD-10-CM

## 2015-05-09 DIAGNOSIS — S6992XA Unspecified injury of left wrist, hand and finger(s), initial encounter: Secondary | ICD-10-CM | POA: Diagnosis present

## 2015-05-09 DIAGNOSIS — Y9289 Other specified places as the place of occurrence of the external cause: Secondary | ICD-10-CM | POA: Diagnosis not present

## 2015-05-09 DIAGNOSIS — Y998 Other external cause status: Secondary | ICD-10-CM | POA: Insufficient documentation

## 2015-05-09 MED ORDER — OXYCODONE-ACETAMINOPHEN 5-325 MG PO TABS
1.0000 | ORAL_TABLET | Freq: Once | ORAL | Status: AC
  Administered 2015-05-09: 1 via ORAL
  Filled 2015-05-09: qty 1

## 2015-05-09 MED ORDER — ETOMIDATE 2 MG/ML IV SOLN
6.0000 mg | Freq: Once | INTRAVENOUS | Status: AC
  Administered 2015-05-09: 6 mg via INTRAVENOUS
  Filled 2015-05-09: qty 10

## 2015-05-09 MED ORDER — OXYCODONE-ACETAMINOPHEN 5-325 MG PO TABS: 1.0000 | ORAL_TABLET | Freq: Four times a day (QID) | ORAL | Status: DC | PRN

## 2015-05-09 NOTE — ED Notes (Signed)
Pt presents to ED with LEFT wrist pain after riding hover board and falling backward using outstretched hand to brace self; swelling noted

## 2015-05-09 NOTE — ED Provider Notes (Signed)
CSN: [id number]     Arrival date & time 05/09/15  0028 History   First MD Initiated Contact with Patient 05/09/15 0051     Chief Complaint  Patient presents with  . Wrist Pain    LEFT     (Consider location/radiation/quality/duration/timing/severity/associated sxs/prior Treatment) HPI Marisa Williams is a 34 y.o. female presents to emergency department complaining of left wrist injury. Patient states she was in the hover board when she fell backwards on the outstretched hand. Patient reports pain and deformity to the left wrist. She states she tried to apply an ace wrap and ice wrist. She has elevated it at home. She denies any other injuries in the fall. She denies numbness or tingling to the hand. States she is able to move her elbow and her fingers, however she is unable to move her wrist. No prior wrist injuries. Patient is right-handed.  Past Medical History  Diagnosis Date  . Blood transfusion without reported diagnosis 06/2010    with pregnancy and C-Section  . STD (sexually transmitted disease) age 55    Gonorrhea   Past Surgical History  Procedure Laterality Date  . Cesarean section  07/11/2010   Family History  Problem Relation Age of Onset  . Diabetes Mother   . Breast cancer Mother 16    lumpectomy  . Aneurysm Mother     brain about age 55  . Stroke Mother   . Breast cancer Maternal Aunt 53    chemo  . Stomach cancer Maternal Grandmother   . Colon cancer Paternal Grandfather   . Breast cancer Other    Social History  Substance Use Topics  . Smoking status: Current Every Day Smoker -- 0.25 packs/day for 15 years    Types: Cigarettes  . Smokeless tobacco: Never Used  . Alcohol Use: Yes     Comment: socially   OB History    Gravida Para Term Preterm AB TAB SAB Ectopic Multiple Living       Review of Systems  Constitutional: Negative for fever and chills.  Respiratory: Negative for cough, chest tightness and shortness of breath.    Cardiovascular: Negative for chest pain, palpitations and leg swelling.  Musculoskeletal: Positive for joint swelling and arthralgias. Negative for myalgias, neck pain and neck stiffness.  Skin: Negative for rash.  Neurological: Negative for dizziness, weakness and headaches.  All other systems reviewed and are negative.     Allergies  Review of patient's allergies indicates no known allergies.  Home Medications   Prior to Admission medications   Not on File   BP 127/84 mmHg  Pulse 81  Temp(Src) 98.4 F (36.9 C) (Oral)  Resp 18  Ht  (1.549 m)  Wt 73.483 kg  BMI 30.63 kg/m2  SpO2 99%  LMP 04/14/2015 (Approximate) Physical Exam  Constitutional: She appears well-developed and well-nourished. No distress.  HENT:  Head: Normocephalic.  Eyes: Conjunctivae are normal.  Neck: Neck supple.  Cardiovascular: Normal rate, regular rhythm and normal heart sounds.   Pulmonary/Chest: Effort normal and breath sounds normal. No respiratory distress. She has no wheezes. She has no rales.  Musculoskeletal: She exhibits no edema.  Left wrist with obvious deformity. Diffuse tender to palpation. Hand is pink and warm. Full range of motion of all fingers. Normal elbow with no tenderness over the joint or pain with range of motion. Sensation intact over the hand both palmar and dorsal surfaces. Refill less than 2 seconds  distally.  Neurological: She is alert.  Skin: Skin is warm and dry.  Psychiatric: She has a normal mood and affect. Her behavior is normal.  Nursing note and vitals reviewed.   ED Course  Procedures (including critical care time) Labs Review Labs Reviewed - No data to display  Imaging Review Dg Wrist Complete Left  05/09/2015  CLINICAL DATA:  Status post fall backwards while attempting to ride hoverboard. Landed on left wrist, with left wrist pain. Initial encounter. EXAM: LEFT WRIST - COMPLETE 3+ VIEW COMPARISON:  None. FINDINGS: There is a comminuted and mildly  impacted fracture involving the distal radial metaphysis, with mild dorsal displacement and angulation. A mildly displaced ulnar styloid fracture is also seen. The carpal rows articulate normally with the distal radius fragments. Visualized joint spaces are otherwise grossly preserved. Soft tissue swelling is noted about the wrist. IMPRESSION: Comminuted mildly impacted fracture involving the distal radial metaphysis, with mild dorsal displacement and angulation. Mildly displaced ulnar styloid fracture also seen. Electronically Signed   By: Roanna RaiderJeffery  Chang M.D.   On: 05/09/2015 01:33   I have personally reviewed and evaluated these images and lab results as part of my medical decision-making.   EKG Interpretation None      MDM   Final diagnoses:  Distal radius fracture, left, closed, initial encounter    Patient with deformity to the left wrist after falling down on the outstretched hand. Neurovascularly intact. Discussed with Dr. Mina MarbleWeingold, asked for reduction and department, follow-up with him on Tuesday. Sugar tong splint.   Reduction will be done by Dr. Johny DrillingHorton    Aasha Dina, PA-C 05/10/15 [id number]  Shon Batonourtney F Horton, MD 05/11/15 1444

## 2015-05-09 NOTE — Sedation Documentation (Signed)
Malachi BondsWilliam Griffin, Laqueta Lindenrtho; Victor, RT; Enriqueta ShutterLynnze Bishop, RN; Lucky RathkeBonnie Yunus Stoklosa, RN; Dr. Ross Marcusourtney Horton at bedside.

## 2015-05-09 NOTE — ED Notes (Signed)
Patient transported to X-ray 

## 2015-05-09 NOTE — Discharge Instructions (Signed)
You were seen today for a break in her left arm. This was reduced at the bedside. You need to follow-up with the hand surgeon. He'll be given a short course of pain medication. If you develop numbness, tingling or any new or worsening symptoms she should be reevaluated.  Forearm Fracture A forearm fracture is a break in one or both of the bones of your arm that are between the elbow and the wrist. Your forearm is made up of two bones:  Radius. This is the bone on the inside of your arm near your thumb.  Ulna. This is the bone on the outside of your arm near your little finger. Middle forearm fractures usually break both the radius and the ulna. Most forearm fractures that involve both the ulna and radius will require surgery. CAUSES Common causes of this type of fracture include:  Falling on an outstretched arm.  Accidents, such as a car or bike accident.  A hard, direct hit to the middle part of your arm. RISK FACTORS You may be at higher risk for this type of fracture if:  You play contact sports.  You have a condition that causes your bones to be weak or thin (osteoporosis). SIGNS AND SYMPTOMS A forearm fracture causes pain immediately after the injury. Other signs and symptoms include:  An abnormal bend or bump in your arm (deformity).  Swelling.  Numbness or tingling.  Tenderness.  Inability to turn your hand from side to side (rotate).  Bruising. DIAGNOSIS Your health care provider may diagnose a forearm fracture based on:  Your symptoms.  Your medical history, including any recent injury.  A physical exam. Your health care provider will look for any deformity and feel for tenderness over the break. Your health care provider will also check whether the bones are out of place.  An X-ray exam to confirm the diagnosis and learn more about the type of fracture. TREATMENT The goals of treatment are to get the bone or bones in proper position for healing and to keep  the bones from moving so they will heal over time. Your treatment will depend on many factors, especially the type of fracture that you have.  If the fractured bone or bones:  Are in the correct position (nondisplaced), you may only need to wear a cast or a splint.  Have a slightly displaced fracture, you may need to have the bones moved back into place manually (closed reduction) before the splint or cast is put on.  You may have a temporary splint before you have a cast. The splint allows room for some swelling. After a few days, a cast can replace the splint.  You may have to wear the cast for 6-8 weeks or as directed by your health care provider.  The cast may be changed after about 3 weeks or as directed by your health care provider.  After your cast is removed, you may need physical therapy to regain full movement in your wrist or elbow.  You may need emergency surgery if you have:  A fractured bone or bones that are out of position (displaced).  A fracture with multiple fragments (comminuted fracture).  A fracture that breaks the skin (open fracture). This type of fracture may require surgical wires, plates, or screws to hold the bone or bones in place.  You may have X-rays every couple of weeks to check on your healing. HOME CARE INSTRUCTIONS If You Have a Cast:  Do not stick anything inside  the cast to scratch your skin. Doing that increases your risk of infection.  Check the skin around the cast every day. Report any concerns to your health care provider. You may put lotion on dry skin around the edges of the cast. Do not apply lotion to the skin underneath the cast. If You Have a Splint:  Wear it as directed by your health care provider. Remove it only as directed by your health care provider.  Loosen the splint if your fingers become numb and tingle, or if they turn cold and blue. Bathing  Cover the cast or splint with a watertight plastic bag to protect it from  water while you bathe or shower. Do not let the cast or splint get wet. Managing Pain, Stiffness, and Swelling  If directed, apply ice to the injured area:  Put ice in a plastic bag.  Place a towel between your skin and the bag.  Leave the ice on for 20 minutes, 2-3 times a day.  Move your fingers often to avoid stiffness and to lessen swelling.  Raise the injured area above the level of your heart while you are sitting or lying down. Driving  Do not drive or operate heavy machinery while taking pain medicine.  Do not drive while wearing a cast or splint on a hand that you use for driving. Activity  Return to your normal activities as directed by your health care provider. Ask your health care provider what activities are safe for you.  Perform range-of-motion exercises only as directed by your health care provider. Safety  Do not use your injured limb to support your body weight until your health care provider says that you can. General Instructions  Do not put pressure on any part of the cast or splint until it is fully hardened. This may take several hours.  Keep the cast or splint clean and dry.  Do not use any tobacco products, including cigarettes, chewing tobacco, or electronic cigarettes. Tobacco can delay bone healing. If you need help quitting, ask your health care provider.  Take medicines only as directed by your health care provider.  Keep all follow-up visits as directed by your health care provider. This is important. SEEK MEDICAL CARE IF:  Your pain medicine is not helping.  Your cast or splint becomes wet or damaged or suddenly feels too tight.  Your cast becomes loose.  You have more severe pain or swelling than you did before the cast.  You have severe pain when you stretch your fingers.  You continue to have pain or stiffness in your elbow or your wrist after your cast is removed. SEEK IMMEDIATE MEDICAL CARE IF:  You cannot move your  fingers.  You lose feeling in your fingers or your hand.  Your hand or your fingers turn cold and pale or blue.  You notice a bad smell coming from your cast.  You have drainage from underneath your cast.  You have new stains from blood or drainage that is coming through your cast.   This information is not intended to replace advice given to you by your health care provider. Make sure you discuss any questions you have with your health care provider.   Document Released: 04/27/2000 Document Revised: 05/21/2014 Document Reviewed: 12/14/2013 Elsevier Interactive Patient Education [employer] Elsevier Inc.

## 2015-05-09 NOTE — Sedation Documentation (Signed)
Called xray to notify of need for portable xray of L wrist.

## 2015-05-09 NOTE — ED Notes (Signed)
Respiratory and ortho called
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
## 2015-05-31 ENCOUNTER — Encounter: Payer: Self-pay | Admitting: Nurse Practitioner

## 2015-07-08 ENCOUNTER — Encounter: Payer: Self-pay | Admitting: Obstetrics and Gynecology

## 2016-01-09 ENCOUNTER — Ambulatory Visit: Payer: PRIVATE HEALTH INSURANCE | Admitting: Nurse Practitioner

## 2017-07-24 ENCOUNTER — Encounter: Payer: Self-pay | Admitting: Genetic Counselor

## 2017-07-24 ENCOUNTER — Inpatient Hospital Stay: Payer: Commercial Managed Care - PPO | Attending: Genetic Counselor | Admitting: Genetic Counselor

## 2017-07-24 ENCOUNTER — Inpatient Hospital Stay: Payer: Commercial Managed Care - PPO

## 2017-07-24 DIAGNOSIS — Z7183 Encounter for nonprocreative genetic counseling: Secondary | ICD-10-CM | POA: Diagnosis not present

## 2017-07-24 DIAGNOSIS — Z803 Family history of malignant neoplasm of breast: Secondary | ICD-10-CM | POA: Diagnosis not present

## 2017-07-24 DIAGNOSIS — Z8481 Family history of carrier of genetic disease: Secondary | ICD-10-CM

## 2017-07-25 ENCOUNTER — Encounter: Payer: Self-pay | Admitting: Genetic Counselor

## 2017-07-25 DIAGNOSIS — Z8481 Family history of carrier of genetic disease: Secondary | ICD-10-CM | POA: Insufficient documentation

## 2017-07-25 DIAGNOSIS — Z803 Family history of malignant neoplasm of breast: Secondary | ICD-10-CM | POA: Insufficient documentation

## 2017-07-25 NOTE — Progress Notes (Signed)
REFERRING PROVIDER: Bobbye Charleston, MD [address] [place], [place] [address]  PRIMARY PROVIDER:  Patient, No Pcp Per  PRIMARY REASON FOR VISIT:  1. Family history of breast cancer   2. Family history of BRCA2 gene positive      HISTORY OF PRESENT ILLNESS:   Ms. Hipwell, a 37 y.o. female, was seen for a  cancer genetics consultation at the request of Dr. Philis Pique due to a family history of cancer.  Ms. Graw presents to clinic today to discuss the possibility of a hereditary predisposition to cancer, genetic testing, and to further clarify her future cancer risks, as well as potential cancer risks for family members.   Ms. Lutes is a 37 y.o. female with no personal history of cancer.  Her maternal half sister was recently diagnosed with breast cancer at age 53.  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  CANCER HISTORY:   No history exists.     HORMONAL RISK FACTORS:  Menarche was at age 61.  First live birth at age 64.  OCP use for approximately 1 years.  Ovaries intact: yes.  Hysterectomy: no.  Menopausal status: premenopausal.  HRT use: 0 years. Colonoscopy: no; not examined. Mammogram within the last year: no. Number of breast biopsies: 0. Up to date with pelvic exams:  no. Any excessive radiation exposure in the past:  no  Past Medical History:  Diagnosis Date  . Blood transfusion without reported diagnosis 06/2010   with pregnancy and C-Section  . Family history of BRCA2 gene positive   . Family history of breast cancer   . STD (sexually transmitted disease) age 59   Gonorrhea    Past Surgical History:  Procedure Laterality Date  . CESAREAN SECTION  07/11/2010    Social History   Socioeconomic History  . Marital status: Single    Spouse name: Not on file  . Number of children: Not on file  . Years of education: Not on file  . Highest education level: Not on file  Social Needs  .  Financial resource strain: Not on file  . Food insecurity - worry: Not on file  . Food insecurity - inability: Not on file  . Transportation needs - medical: Not on file  . Transportation needs - non-medical: Not on file  Occupational History  . Not on file  Tobacco Use  . Smoking status: Current Every Day Smoker    Packs/day: 0.25    Years: 15.00    Pack years: 3.75    Types: Cigarettes  . Smokeless tobacco: Never Used  Substance and Sexual Activity  . Alcohol use: Yes    Comment: socially  . Drug use: Yes    Types: Marijuana  . Sexual activity: Yes    Birth control/protection: IUD  Other Topics Concern  . Not on file  Social History Narrative  . Not on file     FAMILY HISTORY:  We obtained a detailed, 4-generation family history.  Significant diagnoses are listed below: Family History  Problem Relation Age of Onset  . Diabetes Mother   . Breast cancer Mother 56       lumpectomy  . Aneurysm Mother        brain about age 81  . Stroke Mother   . Breast cancer Maternal Aunt 53       chemo  . Stomach cancer Maternal Grandmother   . Colon cancer Paternal Grandfather  or possibly prostate cancer?  . Breast cancer Other   . Breast cancer Sister 28       mat 1/2 sister  . BRCA 1/2 Sister        BRCA2 and ATM pos    The patient has one daughter who is cancer free.  She has a maternal half sister who was recently diagnosed with breast cancer at age 38.  This sister was found to have BRCA2 and ATM compound hereditary mutations. The patient's parents are living.  The patient's mother was diagnosed with breast cancer at 32.  She may have had a hysterectomy.  The patient's mother has two sisters and four brothers.  One sister was diagnosed with breast cancer at 25 and one brother was killed in a fight.  The maternal grandparents are deceased.  The grandmother had "stomach" cancer.  She had one sister who had breast cancer.  The patient's father is living and cancer free.   He had four brothers who are cancer free.  The paternal grandfather is deceased from prostate cancer and the grandmother had cancer but is living at 5.  Patient's maternal ancestors are of [place] American descent, and paternal ancestors are of [place] American and Native American descent. There is no reported Ashkenazi Jewish ancestry. There is no known consanguinity.  GENETIC COUNSELING ASSESSMENT: Amorina Doerr is a 37 y.o. female with a family history of breast cancer and known familial  BRCA2 and ATM mutations which is somewhat suggestive of a hereditary cancer syndrome and predisposition to cancer. We, therefore, discussed and recommended the following at today's visit.   DISCUSSION: We discussed that about 5-10% of breast cancer is hereditary with most cases due to BRCA mutations.  Other genes are associated with moderate risks for hereditary cancer including ATM, CHEK2 and PALB2.  We discussed that differences in risk based on a positive BRCA mutation vs a positive ATM.    The patient has a family history of two hereditary mutations identified in her maternal half sister.  We discussed that her sister could have inherited one of the mutations from her father, and then the patient would not be at risk for that mutation.  Alternatively, both mutations could be inherited from their shared mother.  The patient's greatest risk is that she could at risk for both.  If this is the case, then she has a 25% risk of testing negative for each mutation, 25% chance of testing positive for an ATM mutation, 25% risk for testing a BRCA2 mutation, and a 25% chance of having both.  Ultimately, she has a 75% chance of testing positive for one or more mutations and a 25% chance of testing negative.    If someone has both mutations, their risk is not doubled, but instead we look at the gene with the highest risk for each cancer and follow them based on that risk.  Therefore, if the patient tests positive for both the  ATM and  BRCA2 mutation, the risk for breast, ovarian, pancreatic cancer and melanoma would be based on the BRCA2 mutation.   We reviewed the characteristics, features and inheritance patterns of hereditary cancer syndromes. We also discussed genetic testing, including the appropriate family members to test, the process of testing, insurance coverage and turn-around-time for results. We discussed the implications of a negative, positive and/or variant of uncertain significant result. We recommended Ms. Dano pursue genetic testing for the specific ATM and BRCA2 mutation found in her maternal half sister.   We  discussed that genetic testing would be complementary if we tested for these two mutations, based on the laboratory's policy.  However, if she wanted to undergo additional testing, we could go through her insurance.   Based on Ms. Earll's family history of cancer, she meets medical criteria for genetic testing. Despite that she meets criteria, she may still have an out of pocket cost. We discussed that if her out of pocket cost for testing is over $100, the laboratory will call and confirm whether she wants to proceed with testing.  If the out of pocket cost of testing is less than $100 she will be billed by the genetic testing laboratory.   PLAN: After considering the risks, benefits, and limitations, Ms. Wecker  provided informed consent to pursue genetic testing and the blood sample was sent to [facility] for analysis of the specific ATM and BRCA2 familial mutations. Results should be available within approximately 1-2 weeks' time, at which point they will be disclosed by telephone to Ms. Neisler, as will any additional recommendations warranted by these results. Ms. Revak will receive a summary of her genetic counseling visit and a copy of her results once available. This information will also be available in Epic. We encouraged Ms. Getman to remain in contact with cancer genetics annually  so that we can continuously update the family history and inform her of any changes in cancer genetics and testing that may be of benefit for her family. Ms. Sabree questions were answered to her satisfaction today. Our contact information was provided should additional questions or concerns arise.  Lastly, we encouraged Ms. Kunka to remain in contact with cancer genetics annually so that we can continuously update the family history and inform her of any changes in cancer genetics and testing that may be of benefit for this family.   Ms.  Vanrossum questions were answered to her satisfaction today. Our contact information was provided should additional questions or concerns arise. Thank you for the referral and allowing [place] to share in the care of your patient.   Karen P. Florene Glen, [place], [facility] Certified Genetic Counselor [email or web address] phone: [phone]  The patient was seen for a total of 90 minutes in face-to-face genetic counseling.  This patient was discussed with Drs. Magrinat, Lindi Adie and/or Burr Medico who agrees with the above.    _______________________________________________________________________ For Office Staff:  Number of people involved in session: 2 Was an Intern/ student involved with case: no

## 2017-08-09 ENCOUNTER — Telehealth: Payer: Self-pay | Admitting: Genetic Counselor

## 2017-08-09 ENCOUNTER — Encounter: Payer: Self-pay | Admitting: Genetic Counselor

## 2017-08-09 DIAGNOSIS — Z1379 Encounter for other screening for genetic and chromosomal anomalies: Secondary | ICD-10-CM | POA: Insufficient documentation

## 2017-08-09 NOTE — Telephone Encounter (Signed)
Revealed negative genetic testing for both the BRCA2 and ATM pathogenic variants.

## 2017-08-12 ENCOUNTER — Ambulatory Visit: Payer: Self-pay | Admitting: Genetic Counselor

## 2017-08-12 DIAGNOSIS — Z8481 Family history of carrier of genetic disease: Secondary | ICD-10-CM

## 2017-08-12 DIAGNOSIS — Z1379 Encounter for other screening for genetic and chromosomal anomalies: Secondary | ICD-10-CM

## 2017-08-12 DIAGNOSIS — Z803 Family history of malignant neoplasm of breast: Secondary | ICD-10-CM

## 2017-08-12 NOTE — Progress Notes (Signed)
HPI: Ms. Mcnee was previously seen in the [facility] clinic due to a family history of cancer and concerns regarding a hereditary predisposition to cancer. Please refer to our prior cancer genetics clinic note for more information regarding Ms. Abed's medical, social and family histories, and our assessment and recommendations, at the time. Ms. Stecher recent genetic test results were disclosed to her, as were recommendations warranted by these results. These results and recommendations are discussed in more detail below.  CANCER HISTORY:   No history exists.    FAMILY HISTORY:  We obtained a detailed, 4-generation family history.  Significant diagnoses are listed below: Family History  Problem Relation Age of Onset  . Diabetes Mother   . Breast cancer Mother 50       lumpectomy  . Aneurysm Mother        brain about age 51  . Stroke Mother   . Breast cancer Maternal Aunt 53       chemo  . Stomach cancer Maternal Grandmother   . Colon cancer Paternal Grandfather        or possibly prostate cancer?  . Breast cancer Other   . Breast cancer Sister 19       mat 1/2 sister  . BRCA 1/2 Sister        BRCA2 and ATM pos    The patient has one daughter who is cancer free.  She has a maternal half sister who was recently diagnosed with breast cancer at age 76.  This sister was found to have BRCA2 and ATM compound hereditary mutations. The patient's parents are living.  The patient's mother was diagnosed with breast cancer at 40.  She may have had a hysterectomy.  The patient's mother has two sisters and four brothers.  One sister was diagnosed with breast cancer at 46 and one brother was killed in a fight.  The maternal grandparents are deceased.  The grandmother had "stomach" cancer.  She had one sister who had breast cancer.  The patient's father is living and cancer free.  He had four brothers who are cancer free.  The paternal grandfather is deceased from prostate  cancer and the grandmother had cancer but is living at 8.  Patient's maternal ancestors are of [place] American descent, and paternal ancestors are of [place] American and Native American descent. There is no reported Ashkenazi Jewish ancestry. There is no known consanguinity.  GENETIC TEST RESULTS:  We recommended Ms. Testa pursue testing for the two familial hereditary cancer gene mutations called BRCA2, c.7558C>T and ATM c.7913G>A. Ms. Selph test was normal and did not reveal either of the familial mutations. We call this result a true negative result because the cancer-causing mutation was identified in Ms. Pruiett's family, and she did not inherit it.  Given this negative result, Ms. Radi's chances of developing BRCA2- or ATM-related cancers are the same as they are in the general population.      ADDITIONAL GENETIC TESTING: We discussed with Ms. Eggebrecht that there are other genes that are associated with increased cancer risk that can be analyzed. The laboratories that offer such testing look at these additional genes via a hereditary cancer gene panel. Should Ms. Eckerson wish to pursue additional genetic testing, we are happy to discuss and coordinate this testing, at any time.    CANCER SCREENING RECOMMENDATIONS: This normal result is reassuring and indicates that Ms. Hardgrove does not likely have an increased risk of cancer due to a mutation in one  of these genes.  We, therefore, recommended  Ms. Deleonardis continue to follow the cancer screening guidelines provided by her primary healthcare providers.   An individual's cancer risk and medical management are not determined by genetic test results alone. Overall cancer risk assessment incorporates additional factors, including personal medical history, family history, and any available genetic information that may result in a personalized plan for cancer prevention and surveillance.  RECOMMENDATIONS FOR FAMILY MEMBERS: Women in this family  might be at some increased risk of developing cancer, over the general population risk, simply due to the family history of cancer. We recommended women in this family have a yearly mammogram beginning at age 85, or 61 years younger than the earliest onset of cancer, an annual clinical breast exam, and perform monthly breast self-exams. Women in this family should also have a gynecological exam as recommended by their primary provider. All family members should have a colonoscopy by age 75.  Based on Ms. Brassell's family history, we recommended her mother, who was diagnosed with breast cancer, have genetic counseling and testing. Ms. Naron will let us know if we can be of any assistance in coordinating genetic counseling and/or testing for this family member.   FOLLOW-UP: Lastly, we discussed with Ms. Speyer that cancer genetics is a rapidly advancing field and it is possible that new genetic tests will be appropriate for her and/or her family members in the future. We encouraged her to remain in contact with cancer genetics on an annual basis so we can update her personal and family histories and let her know of advances in cancer genetics that may benefit this family.   Our contact number was provided. Ms. Heney questions were answered to her satisfaction, and she knows she is welcome to call us at anytime with additional questions or concerns.   Roma Kayser, MS, [facility] Certified Genetic Counselor [email or web address]

## 2017-08-14 ENCOUNTER — Encounter: Payer: Commercial Managed Care - PPO | Admitting: Genetic Counselor

## 2017-10-29 LAB — OB RESULTS CONSOLE ABO/RH: RH Type: POSITIVE

## 2017-10-29 LAB — OB RESULTS CONSOLE RUBELLA ANTIBODY, IGM: RUBELLA: IMMUNE

## 2017-10-29 LAB — OB RESULTS CONSOLE HEPATITIS B SURFACE ANTIGEN: HEP B S AG: NEGATIVE

## 2017-10-29 LAB — OB RESULTS CONSOLE RPR: RPR: NONREACTIVE

## 2017-10-29 LAB — OB RESULTS CONSOLE HIV ANTIBODY (ROUTINE TESTING): HIV: NONREACTIVE

## 2017-10-29 LAB — OB RESULTS CONSOLE GC/CHLAMYDIA
Chlamydia: NEGATIVE
Gonorrhea: NEGATIVE

## 2017-10-29 LAB — OB RESULTS CONSOLE ANTIBODY SCREEN: ANTIBODY SCREEN: NEGATIVE

## 2018-02-26 ENCOUNTER — Other Ambulatory Visit: Payer: Self-pay | Admitting: Obstetrics and Gynecology

## 2018-03-25 ENCOUNTER — Telehealth (HOSPITAL_COMMUNITY): Payer: Self-pay | Admitting: *Deleted

## 2018-03-25 ENCOUNTER — Encounter (HOSPITAL_COMMUNITY): Payer: Self-pay | Admitting: *Deleted

## 2018-03-25 NOTE — Telephone Encounter (Signed)
Preadmission screen  

## 2018-03-28 ENCOUNTER — Telehealth (HOSPITAL_COMMUNITY): Payer: Self-pay | Admitting: *Deleted

## 2018-03-28 NOTE — Telephone Encounter (Signed)
Preadmission screen  

## 2018-03-31 ENCOUNTER — Encounter (HOSPITAL_COMMUNITY): Payer: Self-pay

## 2018-04-08 ENCOUNTER — Encounter (HOSPITAL_COMMUNITY)
Admission: RE | Admit: 2018-04-08 | Discharge: 2018-04-08 | Disposition: A | Discharge status: Home / Self Care | Payer: Commercial Managed Care - PPO | Source: Ambulatory Visit | Attending: Obstetrics and Gynecology | Admitting: Obstetrics and Gynecology

## 2018-04-08 DIAGNOSIS — Z8759 Personal history of other complications of pregnancy, childbirth and the puerperium: Secondary | ICD-10-CM

## 2018-04-08 LAB — CBC
HEMATOCRIT: 33.2 % — AB (ref 36.0–46.0)
Hemoglobin: 10.5 g/dL — ABNORMAL LOW (ref 12.0–15.0)
MCH: 27.6 pg (ref 26.0–34.0)
MCHC: 31.6 g/dL (ref 30.0–36.0)
MCV: 87.4 fL (ref 80.0–100.0)
NRBC: 0 % (ref 0.0–0.2)
Platelets: 181 10*3/uL (ref 150–400)
RBC: 3.8 MIL/uL — ABNORMAL LOW (ref 3.87–5.11)
RDW: 15.6 % — AB (ref 11.5–15.5)
WBC: 8.9 10*3/uL (ref 4.0–10.5)

## 2018-04-08 LAB — ABO/RH: ABO/RH(D): A POS

## 2018-04-08 LAB — TYPE AND SCREEN
ABO/RH(D): A POS
ANTIBODY SCREEN: NEGATIVE

## 2018-04-08 LAB — RPR: RPR Ser Ql: NONREACTIVE

## 2018-04-08 MED ORDER — LACTATED RINGERS IV SOLN
INTRAVENOUS | Status: DC
  Administered 2018-04-09 (×2): via INTRAVENOUS

## 2018-04-08 MED ORDER — CEFAZOLIN SODIUM-DEXTROSE 2-4 GM/100ML-% IV SOLN
2.0000 g | INTRAVENOUS | Status: AC
  Administered 2018-04-09: 2 g via INTRAVENOUS
  Filled 2018-04-08: qty 100

## 2018-04-08 NOTE — Patient Instructions (Signed)
Marisa HerbLetraea Deviney  04/08/2018   Your procedure is scheduled on:  04/09/2018  Enter through the Main Entrance of [facility] at 0530 AM.  Pick up the phone at the desk and dial [phone]  Call this number if you have problems the morning of surgery:[phone]  Remember:   Do not eat food:(After Midnight) Desps de medianoche.  Do not drink clear liquids: (After Midnight) Desps de medianoche.  Take these medicines the morning of surgery with A SIP OF WATER: none   Do not wear jewelry, make-up or nail polish.  Do not wear lotions, powders, or perfumes. Do not wear deodorant.  Do not shave 48 hours prior to surgery.  Do not bring valuables to the hospital.  [facility] is not   responsible for any belongings or valuables brought to the hospital.  Contacts, dentures or bridgework may not be worn into surgery.  Leave suitcase in the car. After surgery it may be brought to your room.  For patients admitted to the hospital, checkout time is 11:00 AM the day of              discharge.    N/A   Please read over the following fact sheets that you were given:   Surgical Site Infection Prevention

## 2018-04-09 ENCOUNTER — Inpatient Hospital Stay (HOSPITAL_COMMUNITY)
Admission: AD | Admit: 2018-04-09 | Discharge: 2018-04-11 | DRG: 785 | Disposition: A | Discharge status: Home / Self Care | Payer: Commercial Managed Care - PPO | Attending: Obstetrics and Gynecology | Admitting: Obstetrics and Gynecology

## 2018-04-09 ENCOUNTER — Inpatient Hospital Stay (HOSPITAL_COMMUNITY): Payer: Commercial Managed Care - PPO

## 2018-04-09 ENCOUNTER — Encounter (HOSPITAL_COMMUNITY): Payer: Self-pay | Admitting: *Deleted

## 2018-04-09 ENCOUNTER — Encounter (HOSPITAL_COMMUNITY)
Admission: AD | Disposition: A | Discharge status: Home / Self Care | Payer: Self-pay | Source: Home / Self Care | Attending: Obstetrics and Gynecology

## 2018-04-09 DIAGNOSIS — O34212 Maternal care for vertical scar from previous cesarean delivery: Secondary | ICD-10-CM | POA: Diagnosis present

## 2018-04-09 DIAGNOSIS — Z302 Encounter for sterilization: Secondary | ICD-10-CM

## 2018-04-09 DIAGNOSIS — Z3A36 36 weeks gestation of pregnancy: Secondary | ICD-10-CM | POA: Diagnosis not present

## 2018-04-09 DIAGNOSIS — Z87891 Personal history of nicotine dependence: Secondary | ICD-10-CM | POA: Diagnosis not present

## 2018-04-09 DIAGNOSIS — Z8759 Personal history of other complications of pregnancy, childbirth and the puerperium: Secondary | ICD-10-CM

## 2018-04-09 HISTORY — PX: TUBAL LIGATION: SHX77

## 2018-04-09 SURGERY — Surgical Case
Anesthesia: Spinal | Site: Abdomen | Wound class: Clean Contaminated

## 2018-04-09 MED ORDER — DIPHENHYDRAMINE HCL 50 MG/ML IJ SOLN: 12.5000 mg | INTRAMUSCULAR | Status: DC | PRN

## 2018-04-09 MED ORDER — NALBUPHINE HCL 10 MG/ML IJ SOLN: 5.0000 mg | Freq: Once | INTRAMUSCULAR | Status: AC | PRN

## 2018-04-09 MED ORDER — SIMETHICONE 80 MG PO CHEW
80.0000 mg | CHEWABLE_TABLET | Freq: Three times a day (TID) | ORAL | Status: DC
  Administered 2018-04-09 – 2018-04-11 (×6): 80 mg via ORAL
  Filled 2018-04-09 (×6): qty 1

## 2018-04-09 MED ORDER — OXYCODONE-ACETAMINOPHEN 5-325 MG PO TABS: 2.0000 | ORAL_TABLET | ORAL | Status: DC | PRN

## 2018-04-09 MED ORDER — ACETAMINOPHEN 325 MG PO TABS
650.0000 mg | ORAL_TABLET | ORAL | Status: DC | PRN
  Administered 2018-04-10: 650 mg via ORAL
  Filled 2018-04-09: qty 2

## 2018-04-09 MED ORDER — MEPERIDINE HCL 25 MG/ML IJ SOLN: 6.2500 mg | INTRAMUSCULAR | Status: DC | PRN

## 2018-04-09 MED ORDER — NALBUPHINE HCL 10 MG/ML IJ SOLN
5.0000 mg | Freq: Once | INTRAMUSCULAR | Status: AC | PRN
  Administered 2018-04-09: 5 mg via SUBCUTANEOUS

## 2018-04-09 MED ORDER — FENTANYL CITRATE (PF) 100 MCG/2ML IJ SOLN
INTRAMUSCULAR | Status: AC
  Filled 2018-04-09: qty 2

## 2018-04-09 MED ORDER — SIMETHICONE 80 MG PO CHEW: 80.0000 mg | CHEWABLE_TABLET | ORAL | Status: DC | PRN

## 2018-04-09 MED ORDER — IBUPROFEN 600 MG PO TABS
600.0000 mg | ORAL_TABLET | Freq: Four times a day (QID) | ORAL | Status: DC
  Administered 2018-04-09 – 2018-04-11 (×7): 600 mg via ORAL
  Filled 2018-04-09 (×7): qty 1

## 2018-04-09 MED ORDER — ONDANSETRON HCL 4 MG/2ML IJ SOLN
INTRAMUSCULAR | Status: DC | PRN
  Administered 2018-04-09: 4 mg via INTRAVENOUS

## 2018-04-09 MED ORDER — PROMETHAZINE HCL 25 MG/ML IJ SOLN: 6.2500 mg | INTRAMUSCULAR | Status: DC | PRN

## 2018-04-09 MED ORDER — MORPHINE SULFATE (PF) 0.5 MG/ML IJ SOLN
INTRAMUSCULAR | Status: AC
  Filled 2018-04-09: qty 10

## 2018-04-09 MED ORDER — COCONUT OIL OIL: 1.0000 "application " | TOPICAL_OIL | Status: DC | PRN

## 2018-04-09 MED ORDER — SCOPOLAMINE 1 MG/3DAYS TD PT72
1.0000 | MEDICATED_PATCH | Freq: Once | TRANSDERMAL | Status: DC
  Administered 2018-04-09: 1.5 mg via TRANSDERMAL
  Filled 2018-04-09: qty 1

## 2018-04-09 MED ORDER — HYDROMORPHONE HCL 1 MG/ML IJ SOLN: 0.2500 mg | INTRAMUSCULAR | Status: DC | PRN

## 2018-04-09 MED ORDER — NALOXONE HCL 0.4 MG/ML IJ SOLN: 0.4000 mg | INTRAMUSCULAR | Status: DC | PRN

## 2018-04-09 MED ORDER — DIPHENHYDRAMINE HCL 25 MG PO CAPS
25.0000 mg | ORAL_CAPSULE | ORAL | Status: DC | PRN
  Filled 2018-04-09: qty 1

## 2018-04-09 MED ORDER — SIMETHICONE 80 MG PO CHEW
80.0000 mg | CHEWABLE_TABLET | ORAL | Status: DC
  Administered 2018-04-09 – 2018-04-11 (×2): 80 mg via ORAL
  Filled 2018-04-09 (×2): qty 1

## 2018-04-09 MED ORDER — OXYTOCIN 10 UNIT/ML IJ SOLN
INTRAVENOUS | Status: DC | PRN
  Administered 2018-04-09: 40 [IU] via INTRAVENOUS

## 2018-04-09 MED ORDER — OXYCODONE-ACETAMINOPHEN 5-325 MG PO TABS: 1.0000 | ORAL_TABLET | ORAL | Status: DC | PRN

## 2018-04-09 MED ORDER — NALBUPHINE HCL 10 MG/ML IJ SOLN
INTRAMUSCULAR | Status: AC
  Filled 2018-04-09: qty 1

## 2018-04-09 MED ORDER — BUPIVACAINE IN DEXTROSE 0.75-8.25 % IT SOLN
INTRATHECAL | Status: DC | PRN
  Administered 2018-04-09: 1.6 mL via INTRATHECAL

## 2018-04-09 MED ORDER — SODIUM CHLORIDE 0.9 % IR SOLN
Status: DC | PRN
  Administered 2018-04-09: 1000 mL

## 2018-04-09 MED ORDER — DEXAMETHASONE SODIUM PHOSPHATE 4 MG/ML IJ SOLN
INTRAMUSCULAR | Status: DC | PRN
  Administered 2018-04-09: 4 mg via INTRAVENOUS

## 2018-04-09 MED ORDER — ZOLPIDEM TARTRATE 5 MG PO TABS: 5.0000 mg | ORAL_TABLET | Freq: Every evening | ORAL | Status: DC | PRN

## 2018-04-09 MED ORDER — NALBUPHINE HCL 10 MG/ML IJ SOLN
5.0000 mg | INTRAMUSCULAR | Status: DC | PRN
  Administered 2018-04-09: 5 mg via SUBCUTANEOUS

## 2018-04-09 MED ORDER — WITCH HAZEL-GLYCERIN EX PADS: 1.0000 "application " | MEDICATED_PAD | CUTANEOUS | Status: DC | PRN

## 2018-04-09 MED ORDER — MENTHOL 3 MG MT LOZG: 1.0000 | LOZENGE | OROMUCOSAL | Status: DC | PRN

## 2018-04-09 MED ORDER — DIBUCAINE 1 % RE OINT: 1.0000 "application " | TOPICAL_OINTMENT | RECTAL | Status: DC | PRN

## 2018-04-09 MED ORDER — PRENATAL MULTIVITAMIN CH
1.0000 | ORAL_TABLET | Freq: Every day | ORAL | Status: DC
  Administered 2018-04-10 – 2018-04-11 (×2): 1 via ORAL
  Filled 2018-04-09 (×2): qty 1

## 2018-04-09 MED ORDER — KETOROLAC TROMETHAMINE 30 MG/ML IJ SOLN
30.0000 mg | Freq: Four times a day (QID) | INTRAMUSCULAR | Status: AC | PRN
  Administered 2018-04-09: 30 mg via INTRAMUSCULAR

## 2018-04-09 MED ORDER — FENTANYL CITRATE (PF) 100 MCG/2ML IJ SOLN
INTRAMUSCULAR | Status: DC | PRN
  Administered 2018-04-09: 15 ug via INTRATHECAL

## 2018-04-09 MED ORDER — KETOROLAC TROMETHAMINE 30 MG/ML IJ SOLN
30.0000 mg | Freq: Four times a day (QID) | INTRAMUSCULAR | Status: AC | PRN
  Administered 2018-04-09: 30 mg via INTRAVENOUS
  Filled 2018-04-09: qty 1

## 2018-04-09 MED ORDER — ONDANSETRON HCL 4 MG/2ML IJ SOLN: 4.0000 mg | Freq: Three times a day (TID) | INTRAMUSCULAR | Status: DC | PRN

## 2018-04-09 MED ORDER — OXYTOCIN 10 UNIT/ML IJ SOLN
INTRAMUSCULAR | Status: AC
  Filled 2018-04-09: qty 4

## 2018-04-09 MED ORDER — SCOPOLAMINE 1 MG/3DAYS TD PT72: 1.0000 | MEDICATED_PATCH | Freq: Once | TRANSDERMAL | Status: DC

## 2018-04-09 MED ORDER — PHENYLEPHRINE 8 MG IN D5W 100 ML (0.08MG/ML) PREMIX OPTIME
INJECTION | INTRAVENOUS | Status: DC | PRN
  Administered 2018-04-09: 60 ug/min via INTRAVENOUS

## 2018-04-09 MED ORDER — SENNOSIDES-DOCUSATE SODIUM 8.6-50 MG PO TABS
2.0000 | ORAL_TABLET | ORAL | Status: DC
  Administered 2018-04-09 – 2018-04-11 (×2): 2 via ORAL
  Filled 2018-04-09 (×2): qty 2

## 2018-04-09 MED ORDER — PHENYLEPHRINE 40 MCG/ML (10ML) SYRINGE FOR IV PUSH (FOR BLOOD PRESSURE SUPPORT)
PREFILLED_SYRINGE | INTRAVENOUS | Status: DC | PRN
  Administered 2018-04-09: 40 ug via INTRAVENOUS
  Administered 2018-04-09: 120 ug via INTRAVENOUS

## 2018-04-09 MED ORDER — MIDAZOLAM HCL 2 MG/2ML IJ SOLN
INTRAMUSCULAR | Status: AC
  Filled 2018-04-09: qty 2

## 2018-04-09 MED ORDER — OXYTOCIN 40 UNITS IN LACTATED RINGERS INFUSION - SIMPLE MED: 2.5000 [IU]/h | INTRAVENOUS | Status: AC

## 2018-04-09 MED ORDER — NALBUPHINE HCL 10 MG/ML IJ SOLN
5.0000 mg | INTRAMUSCULAR | Status: DC | PRN
  Administered 2018-04-10: 5 mg via INTRAVENOUS
  Filled 2018-04-09: qty 1

## 2018-04-09 MED ORDER — KETOROLAC TROMETHAMINE 30 MG/ML IJ SOLN
INTRAMUSCULAR | Status: AC
  Administered 2018-04-09: 30 mg via INTRAVENOUS
  Filled 2018-04-09: qty 1

## 2018-04-09 MED ORDER — DIPHENHYDRAMINE HCL 25 MG PO CAPS: 25.0000 mg | ORAL_CAPSULE | Freq: Four times a day (QID) | ORAL | Status: DC | PRN

## 2018-04-09 MED ORDER — ONDANSETRON HCL 4 MG/2ML IJ SOLN
INTRAMUSCULAR | Status: AC
  Filled 2018-04-09: qty 2

## 2018-04-09 MED ORDER — TETANUS-DIPHTH-ACELL PERTUSSIS 5-2.5-18.5 LF-MCG/0.5 IM SUSP: 0.5000 mL | Freq: Once | INTRAMUSCULAR | Status: DC

## 2018-04-09 MED ORDER — MORPHINE SULFATE (PF) 0.5 MG/ML IJ SOLN
INTRAMUSCULAR | Status: DC | PRN
  Administered 2018-04-09: .15 mg via INTRATHECAL

## 2018-04-09 MED ORDER — ACETAMINOPHEN 500 MG PO TABS
1000.0000 mg | ORAL_TABLET | Freq: Four times a day (QID) | ORAL | Status: AC
  Administered 2018-04-09 – 2018-04-10 (×3): 1000 mg via ORAL
  Filled 2018-04-09 (×3): qty 2

## 2018-04-09 MED ORDER — KETOROLAC TROMETHAMINE 30 MG/ML IJ SOLN: 30.0000 mg | Freq: Once | INTRAMUSCULAR | Status: DC | PRN

## 2018-04-09 MED ORDER — STERILE WATER FOR IRRIGATION IR SOLN
Status: DC | PRN
  Administered 2018-04-09: 1000 mL

## 2018-04-09 MED ORDER — NALOXONE HCL 4 MG/10ML IJ SOLN
1.0000 ug/kg/h | INTRAVENOUS | Status: DC | PRN
  Filled 2018-04-09: qty 5

## 2018-04-09 MED ORDER — LACTATED RINGERS IV SOLN
INTRAVENOUS | Status: DC
  Administered 2018-04-09 (×2): via INTRAVENOUS

## 2018-04-09 MED ORDER — DEXAMETHASONE SODIUM PHOSPHATE 4 MG/ML IJ SOLN
INTRAMUSCULAR | Status: AC
  Filled 2018-04-09: qty 1

## 2018-04-09 MED ORDER — SODIUM CHLORIDE 0.9% FLUSH: 3.0000 mL | INTRAVENOUS | Status: DC | PRN

## 2018-04-09 SURGICAL SUPPLY — 28 items
CHLORAPREP W/TINT 26ML (MISCELLANEOUS) ×4 IMPLANT
CLAMP CORD UMBIL (MISCELLANEOUS) ×4 IMPLANT
CLIP FILSHIE TUBAL LIGA STRL (Clip) ×4 IMPLANT
CLOTH BEACON ORANGE TIMEOUT ST (SAFETY) ×4 IMPLANT
DRSG OPSITE POSTOP 4X10 (GAUZE/BANDAGES/DRESSINGS) ×4 IMPLANT
ELECT REM PT RETURN 9FT ADLT (ELECTROSURGICAL) ×4
ELECTRODE REM PT RTRN 9FT ADLT (ELECTROSURGICAL) ×2 IMPLANT
GLOVE BIOGEL PI IND STRL 6.5 (GLOVE) ×2 IMPLANT
GLOVE BIOGEL PI IND STRL 7.0 (GLOVE) ×2 IMPLANT
GLOVE BIOGEL PI INDICATOR 6.5 (GLOVE) ×2
GLOVE BIOGEL PI INDICATOR 7.0 (GLOVE) ×2
GLOVE ECLIPSE 6.5 STRL STRAW (GLOVE) ×4 IMPLANT
GOWN STRL REUS W/TWL LRG LVL3 (GOWN DISPOSABLE) ×8 IMPLANT
NS IRRIG 1000ML POUR BTL (IV SOLUTION) ×4 IMPLANT
PACK C SECTION WH (CUSTOM PROCEDURE TRAY) ×4 IMPLANT
PAD ABD 7.5X8 STRL (GAUZE/BANDAGES/DRESSINGS) IMPLANT
PAD OB MATERNITY 4.3X12.25 (PERSONAL CARE ITEMS) ×4 IMPLANT
PENCIL SMOKE EVAC W/HOLSTER (ELECTROSURGICAL) ×4 IMPLANT
RTRCTR C-SECT PINK 25CM LRG (MISCELLANEOUS) ×4 IMPLANT
SPONGE LAP 18X18 RF (DISPOSABLE) ×20 IMPLANT
SUT MON AB 2-0 CT1 27 (SUTURE) ×4 IMPLANT
SUT PDS AB 0 CTX 60 (SUTURE) ×4 IMPLANT
SUT PLAIN 2 0 XLH (SUTURE) ×4 IMPLANT
SUT VIC AB 0 CTX 36 (SUTURE) ×10
SUT VIC AB 0 CTX36XBRD ANBCTRL (SUTURE) ×10 IMPLANT
SUT VIC AB 4-0 KS 27 (SUTURE) ×4 IMPLANT
TOWEL OR 17X24 6PK STRL BLUE (TOWEL DISPOSABLE) ×4 IMPLANT
TRAY FOLEY W/BAG SLVR 14FR LF (SET/KITS/TRAYS/PACK) ×4 IMPLANT

## 2018-04-09 NOTE — Anesthesia Procedure Notes (Addendum)
Spinal  Patient location during procedure: OR Start time: 04/09/2018 7:40 AM End time: 04/09/2018 7:50 AM Staffing Anesthesiologist: Leilani AbleHatchett, Vyom Brass, MD Performed: anesthesiologist  Preanesthetic Checklist Completed: patient identified, site marked, surgical consent, pre-op evaluation, timeout performed, IV checked, risks and benefits discussed and monitors and equipment checked Spinal Block Patient position: sitting Prep: ChloraPrep Patient monitoring: heart rate, continuous pulse ox and blood pressure Approach: midline Location: L4-5 Injection technique: single-shot Needle Needle type: Pencan  Needle gauge: 24 G Needle length: 9 cm Assessment Sensory level: T4

## 2018-04-09 NOTE — Lactation Note (Signed)
This note was copied from a baby's chart. Lactation Consultation Note  Patient Name: Marisa Williams [id number]'XToday's Date: 04/09/2018 Reason for consult: Initial assessment;Late-preterm 34-36.6wks;1st time breastfeeding;Other (Comment)(1swt baby was in NICU and just pumped )  Baby is 6 hours old  LC reviewed doc flow sheets As LC entered the room the [facility] had just finished attempting to latch.  LC reviewed with mom potential feeding behaviors with LPT infants and the importance of feeding with feeding cues and by 3 hours max. ( Goal to feed at least 8-12 times a day).  LC stressed the importance STS feedings and STS between feedings.  LC showed mom hand expressing with permission with 3 tiny drops, and applied them to baby's lips, baby opened mouth and LC got him  to suck on her gloved finger.  LC attempted to latch/ baby opened wide and LC assisted to sandwich areola due to mom  Being so tired/ depth obtained/ and baby actively intermittent suckled for 23 mins with audible  Swallows/ multiple swallows noted and increased with breast compressions ( pointed them out to mom )  per mom comfortable with entire feeding.  Mom asked a lot of breast feeding questions since its her 1st time latching baby had the breast. Mom expressed excitement the baby is breast feeding and feeding.  Marisa Williams has set up a DEBP in the room / mom has not pumped yet, is aware that is part of the Marisa Williams plan/ also supplementing.  Per mom has DEBP from the insurance company and is active with Marisa Williams but won't need a DEBP from them.  Mom receptive to breast feeding teaching and expressed appreciation for Marisa Williams assistance.  Mother informed of post-discharge support and given phone number to the lactation department, including services for phone call assistance; out-patient appointments; and breastfeeding support group. List of other breastfeeding resources in the community given in the handout. Encouraged mother to call for problems or  concerns related to breastfeeding.  Maternal Data Has patient been taught Hand Expression?: Yes Does the patient have breastfeeding experience prior to this delivery?: Yes  Feeding Feeding Type: Breast Fed  LATCH Score Latch: Grasps breast easily, tongue down, lips flanged, rhythmical sucking.  Audible Swallowing: Spontaneous and intermittent  Type of Nipple: Everted at rest and after stimulation  Comfort (Breast/Nipple): Soft / non-tender  Hold (Positioning): Assistance needed to correctly position infant at breast and maintain latch.  LATCH Score: 9  Interventions Interventions: Breast feeding basics reviewed;Assisted with latch;Skin to skin;Breast massage;Hand express;Breast compression;Adjust position;Support pillows;Position options  Lactation Tools Discussed/Used Tools: Pump Breast pump type: Double-Electric Breast Pump WIC Program: Yes   Consult Status Consult Status: Follow-up Date: 04/10/18 Follow-up type: In-patient    Marisa Williams 04/09/2018, 3:02 PM

## 2018-04-09 NOTE — H&P (Signed)
37 y.o. [redacted]w[redacted]d G2P0101 comes in for scheduled repeat c/s d/t history of prior classical cesarean.  Otherwise has good fetal movement and no bleeding.  Past Medical History:  Diagnosis Date  . Blood transfusion without reported diagnosis 06/2010   with pregnancy and C-Section  . Family history of BRCA2 gene positive   . Family history of breast cancer   . STD (sexually transmitted disease) age 37  Gonorrhea    Past Surgical History:  Procedure Laterality Date  . CESAREAN SECTION  07/11/2010  . WRIST SURGERY      OB History  Gravida Para Term Preterm AB Living  2 1 0 1 0 1  SAB TAB Ectopic Multiple Live Births  0 0 0 0 1    # Outcome Date GA Lbr Len/2nd Weight Sex Delivery Anes PTL Lv  2 Current           1 Preterm 07/11/10 [id number] g F CS-LTranv Gen  LIV     Complications: Preeclampsia    Social History   Socioeconomic History  . Marital status: Single    Spouse name: Not on file  . Number of children: Not on file  . Years of education: Not on file  . Highest education level: Not on file  Occupational History  . Not on file  Social Needs  . Financial resource strain: Not hard at all  . Food insecurity:    Worry: Never true    Inability: Never true  . Transportation needs:    Medical: No    Non-medical: Not on file  Tobacco Use  . Smoking status: Former Smoker    Packs/day: 0.25    Years: 15.00    Pack years: 3.75    Types: Cigarettes    Last attempt to quit: 09/28/2017    Years since quitting: 0.5  . Smokeless tobacco: Never Used  Substance and Sexual Activity  . Alcohol use: Yes    Comment: socially  . Drug use: Yes    Types: Marijuana  . Sexual activity: Yes    Birth control/protection: IUD  Lifestyle  . Physical activity:    Days per week: Not on file    Minutes per session: Not on file  . Stress: Only a little  Relationships  . Social connections:    Talks on phone: Not on file    Gets together: Not on file    Attends religious service: Not on  file    Active member of club or organization: Not on file    Attends meetings of clubs or organizations: Not on file    Relationship status: Not on file  . Intimate partner violence:    Fear of current or ex partner: No    Emotionally abused: No    Physically abused: No    Forced sexual activity: No  Other Topics Concern  . Not on file  Social History Narrative  . Not on file   Patient has no known allergies.    Prenatal Transfer Tool  Maternal Diabetes: No Genetic Screening: Normal Maternal Ultrasounds/Referrals: Normal Fetal Ultrasounds or other Referrals:  None Maternal Substance Abuse:  Yes:  Type: Smoker- quit 08/2017 Significant Maternal Medications:  Meds include: Other: ASA 8154mignificant Maternal Lab Results: None, GBS Negative  Other PNC: uncomplicated.  H.o HELLP and IUGR prior pregnanct delivered at 27 weeks requiring 2U PRBCs that delivery, on ASA 44m9mis pregnancy.  AMA    Vitals:   04/09/18 0555  BP:  117/82  Pulse: (!) 103  Resp: 18  Temp: 98.1 F (36.7 C)  TempSrc: Oral  SpO2: 99%  Weight: 79.7 kg  Height: _0  (1.575 m)   Per office exam: Lungs/Cor:  NAD Abdomen:  soft, gravid Ex:  no cords, erythema    A/P   Admit for schedule repeat c/s at 36.4 for h.o prior classical cesarean  GBS Neg  2g Ancef  Other routie pre-op care  Craig, Casimer Bilis

## 2018-04-09 NOTE — Transfer of Care (Signed)
Immediate Anesthesia Transfer of Care Note  Patient: Marisa HerbLetraea Williams  Procedure(s) Performed: CESAREAN SECTION (N/A Abdomen) BILATERAL TUBAL LIGATION (Bilateral Abdomen)  Patient Location: PACU  Anesthesia Type:Spinal  Level of Consciousness: awake, alert , oriented and patient cooperative  Airway & Oxygen Therapy: Patient Spontanous Breathing  Post-op Assessment: Report given to RN and Post -op Vital signs reviewed and stable  Post vital signs: Reviewed and stable  Last Vitals:  Vitals Value Taken Time  BP    Temp    Pulse    Resp    SpO2      Last Pain:  Vitals:   04/09/18 0555  TempSrc: Oral         Complications: No apparent anesthesia complications

## 2018-04-09 NOTE — Anesthesia Preprocedure Evaluation (Signed)
Anesthesia Evaluation  Patient identified by MRN, date of birth, ID band Patient awake    Reviewed: Allergy & Precautions, NPO status , Patient's Chart, lab work & pertinent test results  Airway Mallampati: I       Dental no notable dental hx. (+) Teeth Intact   Pulmonary former smoker,    Pulmonary exam normal        Cardiovascular Normal cardiovascular exam Rhythm:Regular Rate:Normal     Neuro/Psych negative psych ROS   GI/Hepatic negative GI ROS, Neg liver ROS,   Endo/Other  negative endocrine ROS  Renal/GU negative Renal ROS  negative genitourinary   Musculoskeletal negative musculoskeletal ROS (+)   Abdominal (+) + obese,   Peds  Hematology negative hematology ROS (+)   Anesthesia Other Findings   Reproductive/Obstetrics                             Anesthesia Physical Anesthesia Plan  ASA: II  Anesthesia Plan: Spinal   Post-op Pain Management:    Induction:   PONV Risk Score and Plan: 3 and Ondansetron, Dexamethasone and Scopolamine patch - Pre-op  Airway Management Planned: Natural Airway and Nasal Cannula  Additional Equipment:   Intra-op Plan:   Post-operative Plan:   Informed Consent: I have reviewed the patients History and Physical, chart, labs and discussed the procedure including the risks, benefits and alternatives for the proposed anesthesia with the patient or authorized representative who has indicated his/her understanding and acceptance.     Plan Discussed with: CRNA and Surgeon  Anesthesia Plan Comments:         Anesthesia Quick Evaluation

## 2018-04-09 NOTE — Progress Notes (Signed)
Full Body Bair Hugger on top of patient

## 2018-04-09 NOTE — Op Note (Signed)
NAMOtto Herb: Demont, Alonna MEDICAL RECORD [id number] ACCOUNT [id number] DATE OF BIRTH:[birth year] FACILITY: [facility] LOCATION: [facility] PHYSICIAN:Kaelem Brach M. Claiborne BillingsALLAHAN, DO  OPERATIVE REPORT  DATE OF PROCEDURE:  04/09/2018  PREOPERATIVE DIAGNOSES:  Repeat cesarean section for history of classical incision, desired permanent sterilization.  POSTOPERATIVE DIAGNOSES:  Repeat cesarean section for history of classical incision, desired permanent sterilization.  PROCEDURES:  Low transverse cesarean section with bilateral tubal ligation with Filshie clips.  SURGEON:  Philip AspenSidney Terez Freimark, DO  ASSISTANT:  Felipa Eveneriana Clark, MD  ANESTHESIA:  Spinal.  ESTIMATED BLOOD LOSS:  Please see anesthesia report.  FINDINGS:  Female infant in cephalic presentation, Apgars pending NICU, weight pending.  Normal bilateral tubes followed to the fimbriated end.  Left tubes scarred to bowel posteriorly.  Left ovary not seen and unable to palpate definitively.  Right ovary  normal in appearance.  Anterior abdominal wall with indistinct layers, otherwise normal.  SPECIMENS:  Cord blood.  COMPLICATIONS:  None.  CONDITION:  Stable to PACU.  DESCRIPTION OF PROCEDURE:  The patient was taken to the operating room where spinal anesthesia was administered and found to be adequate.  She was prepped and draped in the normal sterile fashion in dorsal supine position with a leftward tilt.  A scalpel  was used to make a Pfannenstiel skin incision and Bovie cautery was used to carry incision down to underlying layer of fascia.  Fascia was incised with a scalpel at the midline and extended laterally with Mayo scissors.  Kocher clamps were placed at the  superior aspect of the fascial incision.  Rectus muscles were dissected off bluntly and sharply.  Kocher clamps were placed at the inferior aspect of the fascial incision and again rectus muscles were dissected off bluntly and sharply.  A hemostat was  used to separate rectus muscle superiorly  and inferiorly with good visualization of the peritoneum and underlying structures.  The peritoneum was entered bluntly.  Peritoneal incision was extended by lateral traction and the abdomen and pelvis were  surveyed manually and findings noted above.  An Alexis self retractor was placed and the vesicouterine peritoneum was identified.  Bladder was low and therefore, no bladder flap was created.  A scalpel was used to make a low transverse cesarean incision  and the amniotic sac was entered sharply with the scalpel.  The incision was extended by cephalic and caudal traction and the infant's head was located, elevated and delivered without difficulty followed by the remainder of the infant's body.  Infant was  bulb suctioned and 30 second delay of cord clamping was performed while the incision was observed and T clamps were placed for hemostasis.  Following 30-60 seconds, the cord was doubly clamped, cut, and infant was handed off to awaiting neonatology.   Cord blood was then collected and external massage of the uterus was performed to increase tone.  Gentle traction on the umbilical cord facilitated delivery of the placenta.  The uterine cavity was cleared of all clot and debris and the uterine incision  was closed with 0 Vicryl in a running locked fashion followed by second layer of vertical imbrication. Two additional figure-of-eight's were placed just to the left of the midline of the incision.  Excellent hemostasis was noted.  The Alexis self  retractor was removed and the right tube was identified, elevated with Babcocks and Filshie clips placed with excellent visualization and completely around the tube.  The same procedure was performed on the left side and no complications were identified.   The uterine incision  was reexamined and found to be hemostatic.  The Alexis self retractor was removed and the layers visible were hemostatic.  The peritoneum was reapproximated and closed with Monocryl in a  running fashion.  The lower half of the  rectus muscles were reapproximated and incorporated into the final closure.  The fascia was then reapproximated and closed with looped PDS in a running fashion.  The subcutaneous tissue was irrigated, dried and minimal use of Bovie cautery was needed to  control bleeders.  Subcutaneous tissue was reapproximated and closed with chromic in 3 separate interrupted sutures.  Skin was then reapproximated and closed with a Mellody Dance needle on Vicryl in a subcuticular fashion.  The patient tolerated this procedure  well.  Sponge, lap and needle counts were correct x2.    The patient was taken to recovery in stable condition.  AN/NUANCE  D:04/09/2018 T:04/09/2018 JOB:[id number]

## 2018-04-09 NOTE — Anesthesia Postprocedure Evaluation (Signed)
Anesthesia Post Note  Patient: Marisa HerbLetraea Williams  Procedure(s) Performed: CESAREAN SECTION (N/A Abdomen) BILATERAL TUBAL LIGATION (Bilateral Abdomen)     Patient location during evaluation: PACU Anesthesia Type: Spinal Level of consciousness: awake Pain management: pain level controlled Vital Signs Assessment: post-procedure vital signs reviewed and stable Respiratory status: spontaneous breathing Cardiovascular status: stable Postop Assessment: no headache, no backache, spinal receding, patient able to bend at knees and no apparent nausea or vomiting Anesthetic complications: no    Last Vitals:  Vitals:   04/09/18 1048 04/09/18 1150  BP: 107/62 112/76  Pulse: 67 (!) 55  Resp: 15 16  Temp: 36.5 C 36.4 C  SpO2: 99% 100%    Last Pain:  Vitals:   04/09/18 1150  TempSrc: Oral   Pain Goal:                 Caren MacadamJohn F Vernis Cabacungan Jr

## 2018-04-09 NOTE — Brief Op Note (Signed)
04/09/2018  9:01 AM  PATIENT:  Marisa Williams  37 y.o. female  PRE-OPERATIVE DIAGNOSIS:  Repeat cesarean section - history of classical incision, desires permanent sterility  POST-OPERATIVE DIAGNOSIS:  Repeat cesarean section - history of classical incision, desires permanent sterility  PROCEDURE:  Procedure(s) with comments: CESAREAN SECTION (N/A) - EDD:  05/03/18 BILATERAL TUBAL LIGATION (Bilateral)- low transverse  SURGEON:  Surgeon(s) and Role:    Philip Aspen* Milen Lengacher, DO - Primary Assistant: Marlow Baarslark, Dyanna, MD  ANESTHESIA:   spinal  EBL:  See anesthesia report  FINDINGS: female infant, cephalic presentation, APGARS pending NICU, wt pending, normal bilateral tubes followed to fimbriated end.  Left tube scarred to bowel posteriorly, left ovary not seen and unable to palpate definitively, right ovary normal appearance.  Anterior abdominal wall with indistinct layers, otherwise norma.  SPECIMEN:  Source of Specimen:  cord blood  DISPOSITION OF SPECIMEN:  N/A  COUNTS:  YES  PLAN OF CARE: Admit to inpatient   PATIENT DISPOSITION:  PACU - hemodynamically stable.   Delay start of Pharmacological VTE agent (>24hrs) due to surgical blood loss or risk of bleeding: not applicable

## 2018-04-10 LAB — CBC
HEMATOCRIT: 27.9 % — AB (ref 36.0–46.0)
Hemoglobin: 8.9 g/dL — ABNORMAL LOW (ref 12.0–15.0)
MCH: 27.7 pg (ref 26.0–34.0)
MCHC: 31.9 g/dL (ref 30.0–36.0)
MCV: 86.9 fL (ref 80.0–100.0)
Platelets: 139 10*3/uL — ABNORMAL LOW (ref 150–400)
RBC: 3.21 MIL/uL — ABNORMAL LOW (ref 3.87–5.11)
RDW: 15.4 % (ref 11.5–15.5)
WBC: 14.7 10*3/uL — ABNORMAL HIGH (ref 4.0–10.5)
nRBC: 0 % (ref 0.0–0.2)

## 2018-04-10 MED ORDER — FERROUS SULFATE 325 (65 FE) MG PO TABS
325.0000 mg | ORAL_TABLET | Freq: Every day | ORAL | Status: DC
  Administered 2018-04-11: 325 mg via ORAL
  Filled 2018-04-10: qty 1

## 2018-04-10 NOTE — Lactation Note (Signed)
This note was copied from a baby's chart. Lactation Consultation Note Baby 36 hrs at time of consult.  Baby had circumcision today. Has been sleepy then fussy. Mom stated baby BF [facility]x 7 minutes [facility] to [facility]. LC assisted baby to breast in football position, baby BF for about 3 minutes. Baby would latch well BF then stop and cry. Mom getting frustrated. Moms breast are filling causing compression less. Anthonette LegatoHarden areas to outer breast bilaterally. Areola edema to Lt. Breast. Demonstrated reverse pressure, breast massage, hand expression. Collected 14 ml transitional milk.  Breast much softer and more compressible. Discussed w/mom assessing for transfer before and after feeding.  Discussed breast filling, engorgement, breast massage, milk storage and supplementation.  Encouraged mom to hand express to soften breast tissue before latching. Pre-pump if needed to extend nipple and relieve pressure w/hand pump. Breast tender, mom very tired. FOB holding baby, baby sleeping soundly. Encouraged mom to sleep while baby is sleeping.  Suggested to mom that after BF she gives baby colostrum of what is pumped and hand expressed d/t weight loss less than 6 lbs. Probably for next wt.  Gave mom supplementation information sheet according to hours of age. Mom can syring feed or spoon feed.  Mom mentioned several times she was trying to BF. Discussed cluster feeding of newborn. Mom pumped and bottle fed her first child d/t baby in NICU 2 months.   Encouraged mom to call for assistance and rest when she can.  Patient Name: Marisa Williams ZOXWR'UToday's Date: 04/10/2018 Reason for consult: Follow-up assessment;Late-preterm 34-36.6wks;Infant < 6lbs   Maternal Data    Feeding Feeding Type: Breast Fed  LATCH Score Latch: Repeated attempts needed to sustain latch, nipple held in mouth throughout feeding, stimulation needed to elicit sucking reflex.  Audible Swallowing: Spontaneous and intermittent  Type  of Nipple: Everted at rest and after stimulation  Comfort (Breast/Nipple): Filling, red/small blisters or bruises, mild/mod discomfort  Hold (Positioning): Assistance needed to correctly position infant at breast and maintain latch.  LATCH Score: 7  Interventions Interventions: Breast feeding basics reviewed;Adjust position;Assisted with latch;Support pillows;Skin to skin;Position options;Breast massage;Expressed milk;Hand express;Pre-pump if needed;Shells;Reverse pressure;Breast compression;Hand pump  Lactation Tools Discussed/Used Tools: Shells;Pump Shell Type: Inverted Breast pump type: Double-Electric Breast Pump;Manual Pump Review: Setup, frequency, and cleaning;Milk Storage Initiated by:: RN Date initiated:: 04/10/18   Consult Status Consult Status: Follow-up Date: 04/11/18 Follow-up type: In-patient    Charyl DancerCARVER, Marisa Rittenhouse G 04/10/2018, 9:41 PM

## 2018-04-10 NOTE — Progress Notes (Signed)
  Patient is eating, ambulating, voiding.  Pain control is good.  Vitals:   04/09/18 2019 04/09/18 2130 04/10/18 0130 04/10/18 0602  BP:  101/74 (!) 99/55 110/66  Pulse:  63 64 65  Resp:      Temp:  98.2 F (36.8 C) (!) 97.5 F (36.4 C) 98 F (36.7 C)  TempSrc:  Oral Oral   SpO2: 99% 99% 99% 100%  Weight:      Height:        lungs:   clear to auscultation cor:    RRR Abdomen:  soft, appropriate tenderness, incisions intact and without erythema or exudate ex:    no cords   Lab Results  Component Value Date   WBC 14.7 (H) 04/10/2018   HGB 8.9 (L) 04/10/2018   HCT 27.9 (L) 04/10/2018   MCV 86.9 04/10/2018   PLT 139 (L) 04/10/2018    --/--/A POS, A POS Performed at [facility], [address] [place], [place] [address]  (11/26 0900)/[place]  A/P    Post operative day 1.  Routine post op and postpartum care.  Expect d/c tomorrow.  Percocet for pain control. Iron.

## 2018-04-10 NOTE — Progress Notes (Signed)
Due to high hospital census and acuity, CSW is unable to meet with MOB to complete assessment for perinatal substance exposure (THC).  CSW notes that baby's UDS is negative and will monitor CDS result.  CSW will make report to Child Protective Services if warranted.  Please consult CSW if concerns arise or by MOB's request.   Folasade Mooty Boyd-Gilyard, MSW, LCSW Clinical Social Work [phone] 

## 2018-04-11 MED ORDER — IBUPROFEN 600 MG PO TABS: 600.0000 mg | ORAL_TABLET | Freq: Four times a day (QID) | ORAL | 0 refills | Status: AC | PRN

## 2018-04-11 MED ORDER — OXYCODONE-ACETAMINOPHEN 5-325 MG PO TABS: 1.0000 | ORAL_TABLET | Freq: Four times a day (QID) | ORAL | 0 refills | Status: AC | PRN

## 2018-04-11 NOTE — Discharge Instructions (Signed)

## 2018-04-11 NOTE — Discharge Summary (Signed)
Obstetric Discharge Summary Reason for Admission: cesarean section Prenatal Procedures: ultrasound Intrapartum Procedures: cesarean: low cervical, transverse Postpartum Procedures: none Complications-Operative and Postpartum: none Hemoglobin  Date Value Ref Range Status  04/10/2018 8.9 (L) 12.0 - 15.0 g/dL Final   Hemoglobin, fingerstick  Date Value Ref Range Status  01/04/2015 12.8 12.0 - 16.0 g/dL Final   HCT  Date Value Ref Range Status  04/10/2018 27.9 (L) 36.0 - 46.0 % Final    Physical Exam:  General: alert, cooperative and appears stated age 68Lochia: appropriate Uterine Fundus: firm Incision: healing well DVT Evaluation: No evidence of DVT seen on physical exam.  Discharge Diagnoses: Preterm pregnancy delivered  Discharge Information: Date: 04/11/2018 Activity: pelvic rest Diet: routine Medications: Ibuprofen and Percocet Condition: improved Instructions: refer to practice specific booklet Discharge to: home Follow-up Information    Marisa Williams, Marisa Mounce, MD Follow up in 4 week(s).   Specialty:  Obstetrics and Gynecology Why:  For a postpartum evaluation Contact information: [address] [place] [address] [phone]           Newborn Data: Live born female  Birth Weight: 6 lb 5.1 oz (2866 g) APGAR: ,   Newborn Delivery   Birth date/time:  04/09/2018 08:12:00 Delivery type:  C-Section, Low Transverse Trial of labor:  No C-section categorization:  Repeat     Home with mother.  Marisa ReedsKendra Mel Williams 04/11/2018, 10:55 AM

## 2020-05-13 ENCOUNTER — Emergency Department (HOSPITAL_BASED_OUTPATIENT_CLINIC_OR_DEPARTMENT_OTHER)
Admission: EM | Admit: 2020-05-13 | Discharge: 2020-05-13 | Disposition: A | Discharge status: Home / Self Care | Payer: Commercial Managed Care - PPO | Attending: Emergency Medicine | Admitting: Emergency Medicine

## 2020-05-13 ENCOUNTER — Other Ambulatory Visit: Payer: Self-pay

## 2020-05-13 DIAGNOSIS — Z5321 Procedure and treatment not carried out due to patient leaving prior to being seen by health care provider: Secondary | ICD-10-CM | POA: Insufficient documentation

## 2020-05-13 DIAGNOSIS — U071 COVID-19: Secondary | ICD-10-CM | POA: Insufficient documentation

## 2020-05-13 LAB — URINALYSIS, ROUTINE W REFLEX MICROSCOPIC
Bilirubin Urine: NEGATIVE
Glucose, UA: NEGATIVE mg/dL
Hgb urine dipstick: NEGATIVE
Leukocytes,Ua: NEGATIVE
Nitrite: NEGATIVE
Protein, ur: NEGATIVE mg/dL
Specific Gravity, Urine: 1.015 (ref 1.005–1.030)
pH: 6.5 (ref 5.0–8.0)

## 2020-05-13 LAB — COMPREHENSIVE METABOLIC PANEL
ALT: 18 U/L (ref 0–44)
AST: 31 U/L (ref 15–41)
Albumin: 4.2 g/dL (ref 3.5–5.0)
Alkaline Phosphatase: 67 U/L (ref 38–126)
Anion gap: 11 (ref 5–15)
BUN: 8 mg/dL (ref 6–20)
CO2: 24 mmol/L (ref 22–32)
Calcium: 8.9 mg/dL (ref 8.9–10.3)
Chloride: 101 mmol/L (ref 98–111)
Creatinine, Ser: 0.91 mg/dL (ref 0.44–1.00)
GFR, Estimated: >60 mL/min (ref >60)
Glucose, Bld: 111 mg/dL — ABNORMAL HIGH (ref 70–99)
Potassium: 3.4 mmol/L — ABNORMAL LOW (ref 3.5–5.1)
Sodium: 136 mmol/L (ref 135–145)
Total Bilirubin: 0.2 mg/dL — ABNORMAL LOW (ref 0.3–1.2)
Total Protein: 7.9 g/dL (ref 6.5–8.1)

## 2020-05-13 LAB — CBC
HCT: 44.9 % (ref 36.0–46.0)
Hemoglobin: 14.8 g/dL (ref 12.0–15.0)
MCH: 30.5 pg (ref 26.0–34.0)
MCHC: 33 g/dL (ref 30.0–36.0)
MCV: 92.6 fL (ref 80.0–100.0)
Platelets: 156 10*3/uL (ref 150–400)
RBC: 4.85 MIL/uL (ref 3.87–5.11)
RDW: 12.9 % (ref 11.5–15.5)
WBC: 5.7 10*3/uL (ref 4.0–10.5)
nRBC: 0 % (ref 0.0–0.2)

## 2020-05-13 LAB — LIPASE, BLOOD: Lipase: 38 U/L (ref 11–51)

## 2020-05-13 LAB — PREGNANCY, URINE: Preg Test, Ur: NEGATIVE

## 2020-05-13 MED ORDER — ONDANSETRON 4 MG PO TBDP
4.0000 mg | ORAL_TABLET | Freq: Once | ORAL | Status: AC | PRN
  Administered 2020-05-13: 4 mg via ORAL
  Filled 2020-05-13: qty 1

## 2020-05-13 NOTE — ED Triage Notes (Signed)
Pt COVID positive on 12/26. Symptoms started on the 24th. Pt having fevers, vomiting and diarrhea. States unable to keep anything down, including water.

## 2020-09-19 ENCOUNTER — Other Ambulatory Visit: Payer: Self-pay | Admitting: Nurse Practitioner

## 2020-09-19 DIAGNOSIS — Z1231 Encounter for screening mammogram for malignant neoplasm of breast: Secondary | ICD-10-CM

## 2020-11-11 ENCOUNTER — Ambulatory Visit
Admission: RE | Admit: 2020-11-11 | Discharge: 2020-11-11 | Disposition: A | Discharge status: Home / Self Care | Payer: Commercial Managed Care - PPO | Source: Ambulatory Visit | Attending: Nurse Practitioner | Admitting: Nurse Practitioner

## 2020-11-11 ENCOUNTER — Other Ambulatory Visit: Payer: Self-pay

## 2020-11-11 DIAGNOSIS — Z1231 Encounter for screening mammogram for malignant neoplasm of breast: Secondary | ICD-10-CM

## 2020-11-18 ENCOUNTER — Other Ambulatory Visit: Payer: Self-pay | Admitting: Nurse Practitioner

## 2020-11-18 DIAGNOSIS — R928 Other abnormal and inconclusive findings on diagnostic imaging of breast: Secondary | ICD-10-CM

## 2020-12-09 ENCOUNTER — Ambulatory Visit
Admission: RE | Admit: 2020-12-09 | Discharge: 2020-12-09 | Disposition: A | Discharge status: Home / Self Care | Payer: Commercial Managed Care - PPO | Source: Ambulatory Visit | Attending: Nurse Practitioner | Admitting: Nurse Practitioner

## 2020-12-09 ENCOUNTER — Other Ambulatory Visit: Payer: Self-pay

## 2020-12-09 ENCOUNTER — Other Ambulatory Visit: Payer: Self-pay | Admitting: Nurse Practitioner

## 2020-12-09 DIAGNOSIS — R928 Other abnormal and inconclusive findings on diagnostic imaging of breast: Secondary | ICD-10-CM

## 2021-06-12 ENCOUNTER — Ambulatory Visit: Payer: Commercial Managed Care - PPO

## 2021-06-12 ENCOUNTER — Other Ambulatory Visit: Payer: Self-pay | Admitting: Nurse Practitioner

## 2021-06-12 ENCOUNTER — Ambulatory Visit
Admission: RE | Admit: 2021-06-12 | Discharge: 2021-06-12 | Disposition: A | Discharge status: Home / Self Care | Payer: Commercial Managed Care - PPO | Source: Ambulatory Visit | Attending: Nurse Practitioner | Admitting: Nurse Practitioner

## 2021-06-12 ENCOUNTER — Other Ambulatory Visit: Payer: Commercial Managed Care - PPO

## 2021-06-12 DIAGNOSIS — R928 Other abnormal and inconclusive findings on diagnostic imaging of breast: Secondary | ICD-10-CM

## 2021-06-26 ENCOUNTER — Other Ambulatory Visit: Payer: Commercial Managed Care - PPO

## 2021-09-18 ENCOUNTER — Other Ambulatory Visit (HOSPITAL_COMMUNITY)
Admission: RE | Admit: 2021-09-18 | Discharge: 2021-09-18 | Disposition: A | Discharge status: Home / Self Care | Payer: Commercial Managed Care - PPO | Source: Ambulatory Visit | Attending: Nurse Practitioner | Admitting: Nurse Practitioner

## 2021-09-18 ENCOUNTER — Other Ambulatory Visit: Payer: Self-pay | Admitting: Nurse Practitioner

## 2021-09-18 DIAGNOSIS — Z124 Encounter for screening for malignant neoplasm of cervix: Secondary | ICD-10-CM | POA: Diagnosis not present

## 2021-09-25 LAB — CYTOLOGY - PAP
Comment: NEGATIVE
Comment: NEGATIVE
Comment: NEGATIVE
Diagnosis: UNDETERMINED — AB
HPV 16: NEGATIVE
HPV 18 / 45: NEGATIVE
High risk HPV: POSITIVE — AB

## 2021-12-11 ENCOUNTER — Ambulatory Visit
Admission: RE | Admit: 2021-12-11 | Discharge: 2021-12-11 | Disposition: A | Discharge status: Home / Self Care | Payer: Commercial Managed Care - PPO | Source: Ambulatory Visit | Attending: Nurse Practitioner | Admitting: Nurse Practitioner

## 2021-12-11 ENCOUNTER — Other Ambulatory Visit: Payer: Self-pay | Admitting: Nurse Practitioner

## 2021-12-11 DIAGNOSIS — N6320 Unspecified lump in the left breast, unspecified quadrant: Secondary | ICD-10-CM

## 2021-12-11 DIAGNOSIS — R928 Other abnormal and inconclusive findings on diagnostic imaging of breast: Secondary | ICD-10-CM

## 2021-12-15 ENCOUNTER — Other Ambulatory Visit: Payer: Self-pay | Admitting: Nurse Practitioner

## 2021-12-15 DIAGNOSIS — Z803 Family history of malignant neoplasm of breast: Secondary | ICD-10-CM

## 2021-12-15 DIAGNOSIS — Z9189 Other specified personal risk factors, not elsewhere classified: Secondary | ICD-10-CM

## 2021-12-15 DIAGNOSIS — Z1231 Encounter for screening mammogram for malignant neoplasm of breast: Secondary | ICD-10-CM

## 2022-05-14 ENCOUNTER — Other Ambulatory Visit: Payer: Commercial Managed Care - PPO

## 2022-05-16 ENCOUNTER — Other Ambulatory Visit: Payer: Commercial Managed Care - PPO

## 2022-05-27 ENCOUNTER — Ambulatory Visit
Admission: RE | Admit: 2022-05-27 | Discharge: 2022-05-27 | Disposition: A | Discharge status: Home / Self Care | Payer: Commercial Managed Care - PPO | Source: Ambulatory Visit | Attending: Nurse Practitioner | Admitting: Nurse Practitioner

## 2022-05-27 DIAGNOSIS — Z803 Family history of malignant neoplasm of breast: Secondary | ICD-10-CM

## 2022-05-27 DIAGNOSIS — Z9189 Other specified personal risk factors, not elsewhere classified: Secondary | ICD-10-CM

## 2022-05-27 MED ORDER — GADOPICLENOL 0.5 MMOL/ML IV SOLN
8.0000 mL | Freq: Once | INTRAVENOUS | Status: AC | PRN
  Administered 2022-05-27: 8 mL via INTRAVENOUS

## 2022-05-29 ENCOUNTER — Other Ambulatory Visit: Payer: Self-pay | Admitting: Nurse Practitioner

## 2022-05-29 DIAGNOSIS — R928 Other abnormal and inconclusive findings on diagnostic imaging of breast: Secondary | ICD-10-CM

## 2022-06-04 ENCOUNTER — Inpatient Hospital Stay: Admission: RE | Admit: 2022-06-04 | Payer: Commercial Managed Care - PPO | Source: Ambulatory Visit

## 2022-06-14 ENCOUNTER — Ambulatory Visit
Admission: RE | Admit: 2022-06-14 | Discharge: 2022-06-14 | Disposition: A | Discharge status: Home / Self Care | Payer: Commercial Managed Care - PPO | Source: Ambulatory Visit | Attending: Nurse Practitioner | Admitting: Nurse Practitioner

## 2022-06-14 ENCOUNTER — Encounter: Payer: Self-pay | Admitting: Nurse Practitioner

## 2022-06-14 ENCOUNTER — Other Ambulatory Visit: Payer: Self-pay | Admitting: Nurse Practitioner

## 2022-06-14 DIAGNOSIS — R928 Other abnormal and inconclusive findings on diagnostic imaging of breast: Secondary | ICD-10-CM

## 2022-09-12 DIAGNOSIS — Z419 Encounter for procedure for purposes other than remedying health state, unspecified: Secondary | ICD-10-CM | POA: Diagnosis not present

## 2022-09-24 ENCOUNTER — Other Ambulatory Visit (HOSPITAL_COMMUNITY)
Admission: RE | Admit: 2022-09-24 | Discharge: 2022-09-24 | Disposition: A | Discharge status: Home / Self Care | Payer: Commercial Managed Care - PPO | Source: Ambulatory Visit | Attending: Nurse Practitioner | Admitting: Nurse Practitioner

## 2022-09-24 ENCOUNTER — Other Ambulatory Visit: Payer: Self-pay | Admitting: Nurse Practitioner

## 2022-09-24 DIAGNOSIS — Z124 Encounter for screening for malignant neoplasm of cervix: Secondary | ICD-10-CM | POA: Diagnosis present

## 2022-09-26 ENCOUNTER — Other Ambulatory Visit: Payer: Self-pay | Admitting: Nurse Practitioner

## 2022-09-26 DIAGNOSIS — N6019 Diffuse cystic mastopathy of unspecified breast: Secondary | ICD-10-CM

## 2022-09-27 LAB — CYTOLOGY - PAP
Comment: NEGATIVE
Comment: NEGATIVE
Comment: NEGATIVE
HPV 16: NEGATIVE
HPV 18 / 45: NEGATIVE
High risk HPV: POSITIVE — AB

## 2022-10-13 DIAGNOSIS — Z419 Encounter for procedure for purposes other than remedying health state, unspecified: Secondary | ICD-10-CM | POA: Diagnosis not present

## 2022-11-12 DIAGNOSIS — Z419 Encounter for procedure for purposes other than remedying health state, unspecified: Secondary | ICD-10-CM | POA: Diagnosis not present

## 2022-11-14 ENCOUNTER — Other Ambulatory Visit: Payer: Commercial Managed Care - PPO

## 2022-12-13 ENCOUNTER — Ambulatory Visit
Admission: RE | Admit: 2022-12-13 | Discharge: 2022-12-13 | Disposition: A | Discharge status: Home / Self Care | Payer: Commercial Managed Care - PPO | Source: Ambulatory Visit | Attending: Nurse Practitioner | Admitting: Nurse Practitioner

## 2022-12-13 DIAGNOSIS — Z1231 Encounter for screening mammogram for malignant neoplasm of breast: Secondary | ICD-10-CM

## 2022-12-13 DIAGNOSIS — Z419 Encounter for procedure for purposes other than remedying health state, unspecified: Secondary | ICD-10-CM | POA: Diagnosis not present

## 2022-12-26 ENCOUNTER — Other Ambulatory Visit: Payer: Commercial Managed Care - PPO

## 2023-01-13 DIAGNOSIS — Z419 Encounter for procedure for purposes other than remedying health state, unspecified: Secondary | ICD-10-CM | POA: Diagnosis not present

## 2023-02-08 ENCOUNTER — Other Ambulatory Visit: Payer: Commercial Managed Care - PPO

## 2023-02-12 DIAGNOSIS — Z419 Encounter for procedure for purposes other than remedying health state, unspecified: Secondary | ICD-10-CM | POA: Diagnosis not present

## 2023-03-15 DIAGNOSIS — Z419 Encounter for procedure for purposes other than remedying health state, unspecified: Secondary | ICD-10-CM | POA: Diagnosis not present

## 2023-03-19 IMAGING — MG MM DIGITAL DIAGNOSTIC UNILAT*L* W/ TOMO W/ CAD
4 series · 4 of 12 positions shown · non-contrast
Comparison: Previous exam(s).

CLINICAL DATA: 40-year-old female presenting for six-month
follow-up of a probably benign left breast mass.

EXAM:
DIGITAL DIAGNOSTIC UNILATERAL LEFT MAMMOGRAM WITH TOMOSYNTHESIS AND
CAD
TECHNIQUE: Left digital diagnostic mammography and breast tomosynthesis was
performed. The images were evaluated with computer-aided detection.

[L CC synth-2D]
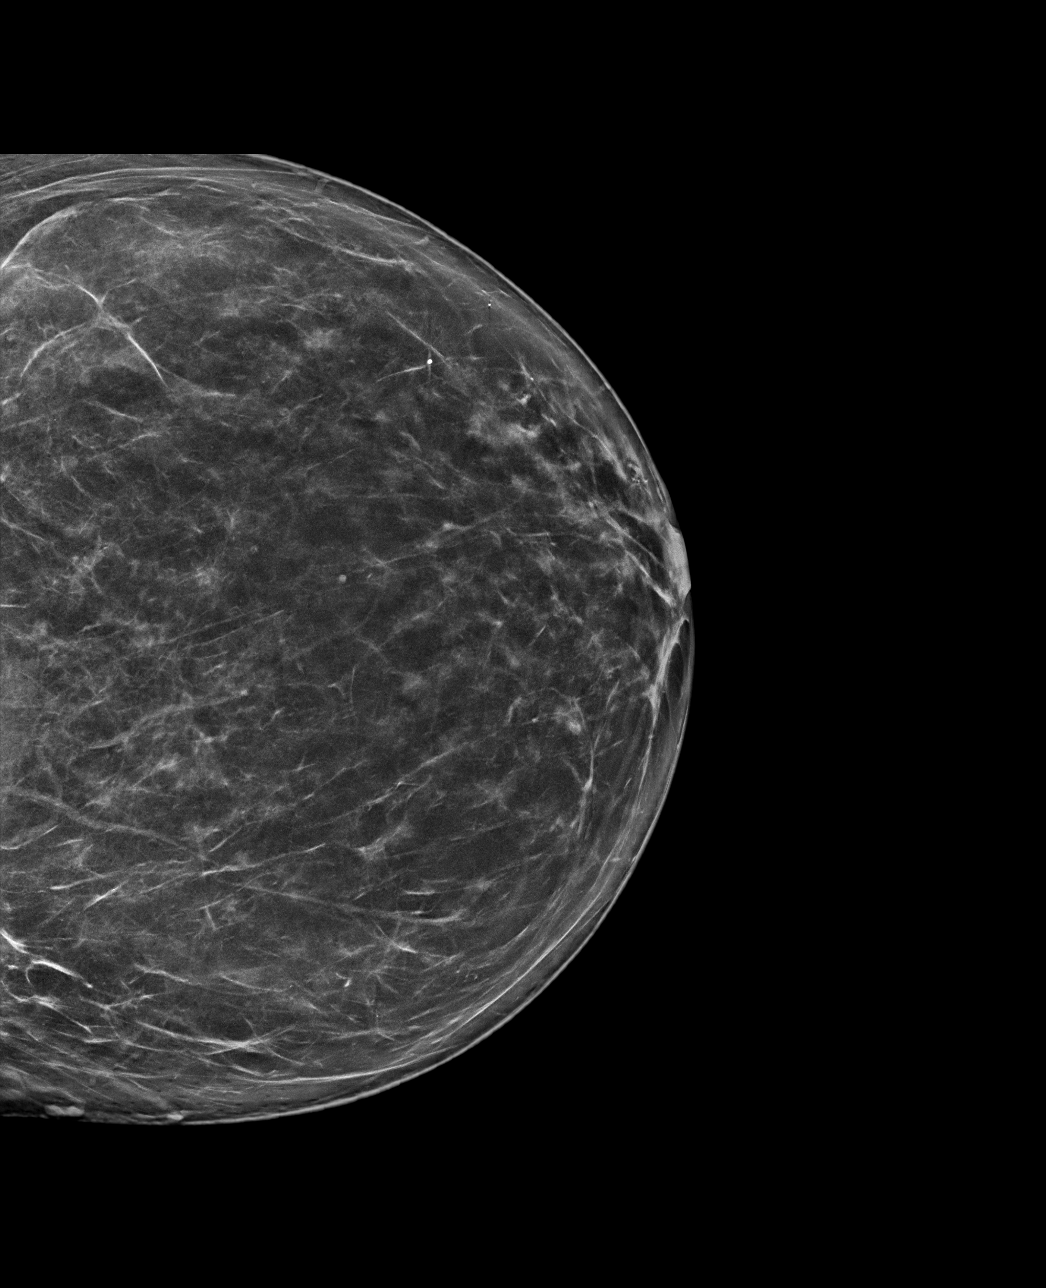

[L MLO synth-2D]
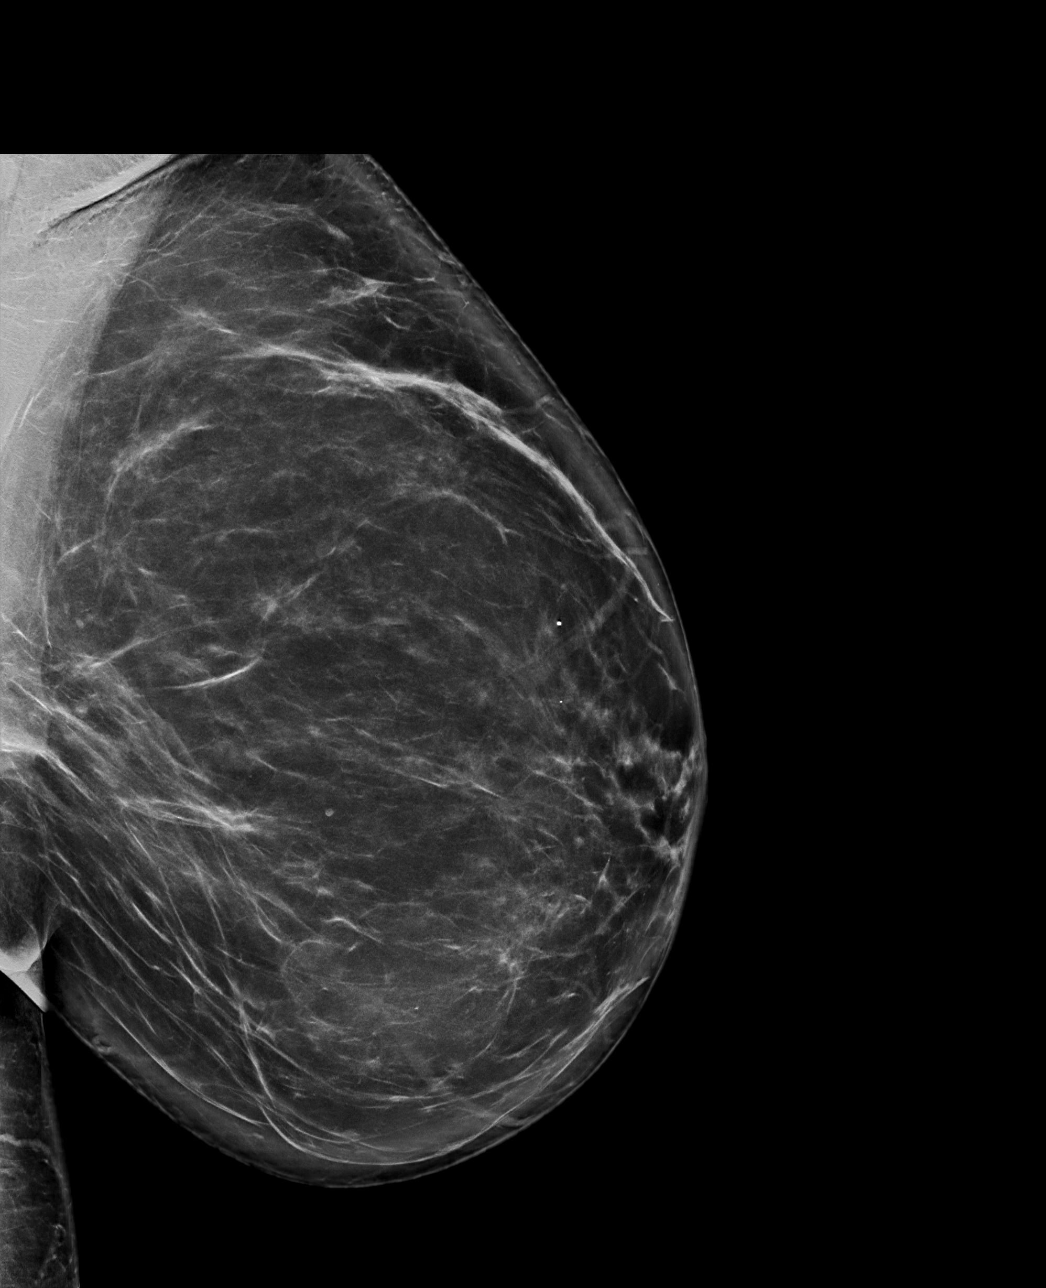

[L CC tomo · tomo slice 43/84.0]
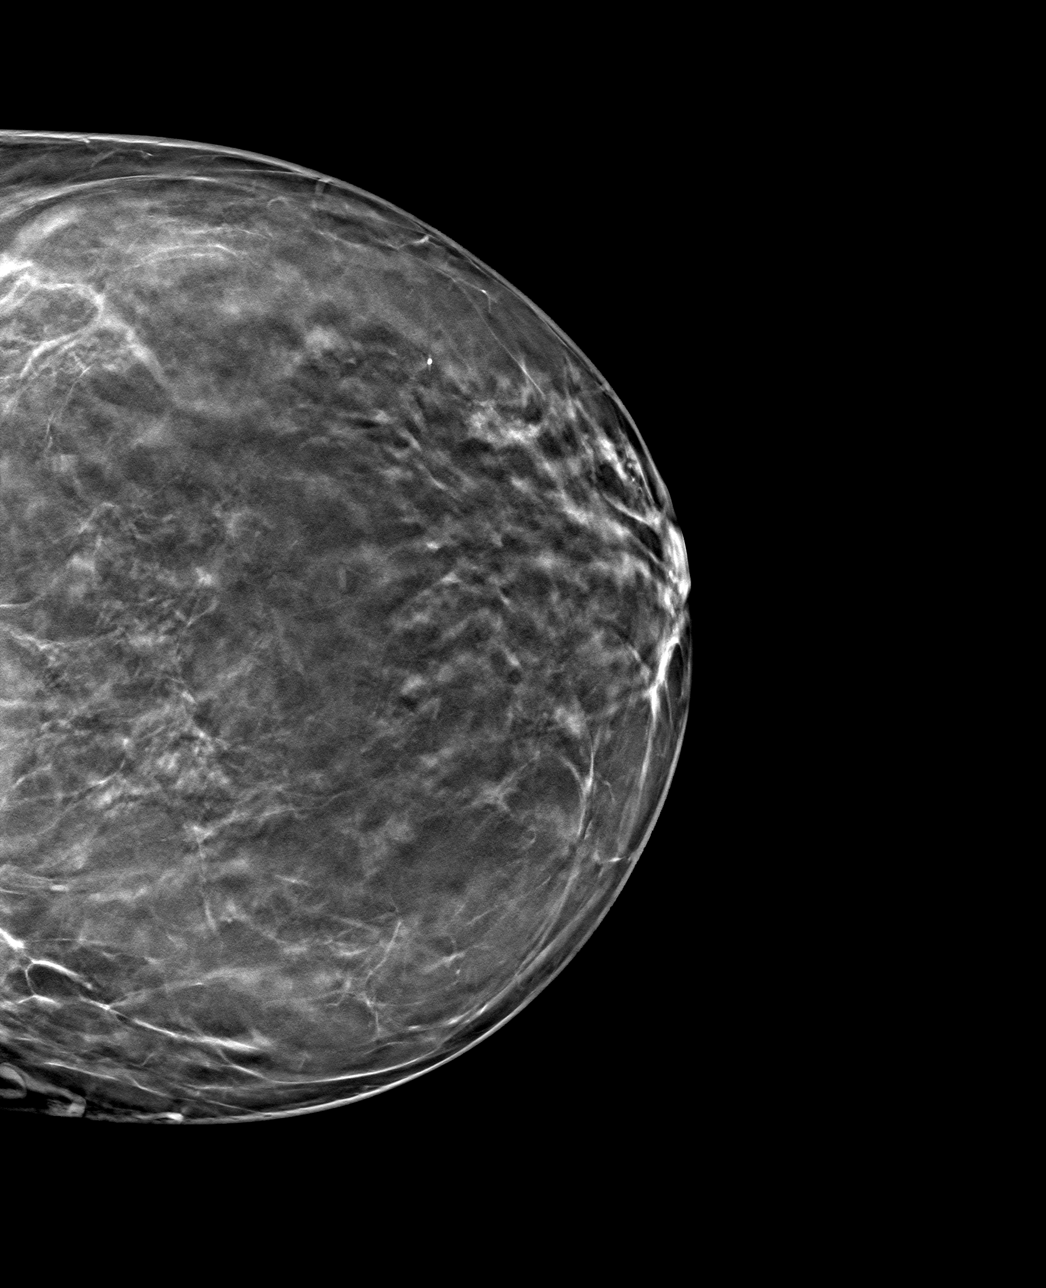

[L MLO tomo · tomo slice 51/100.0]
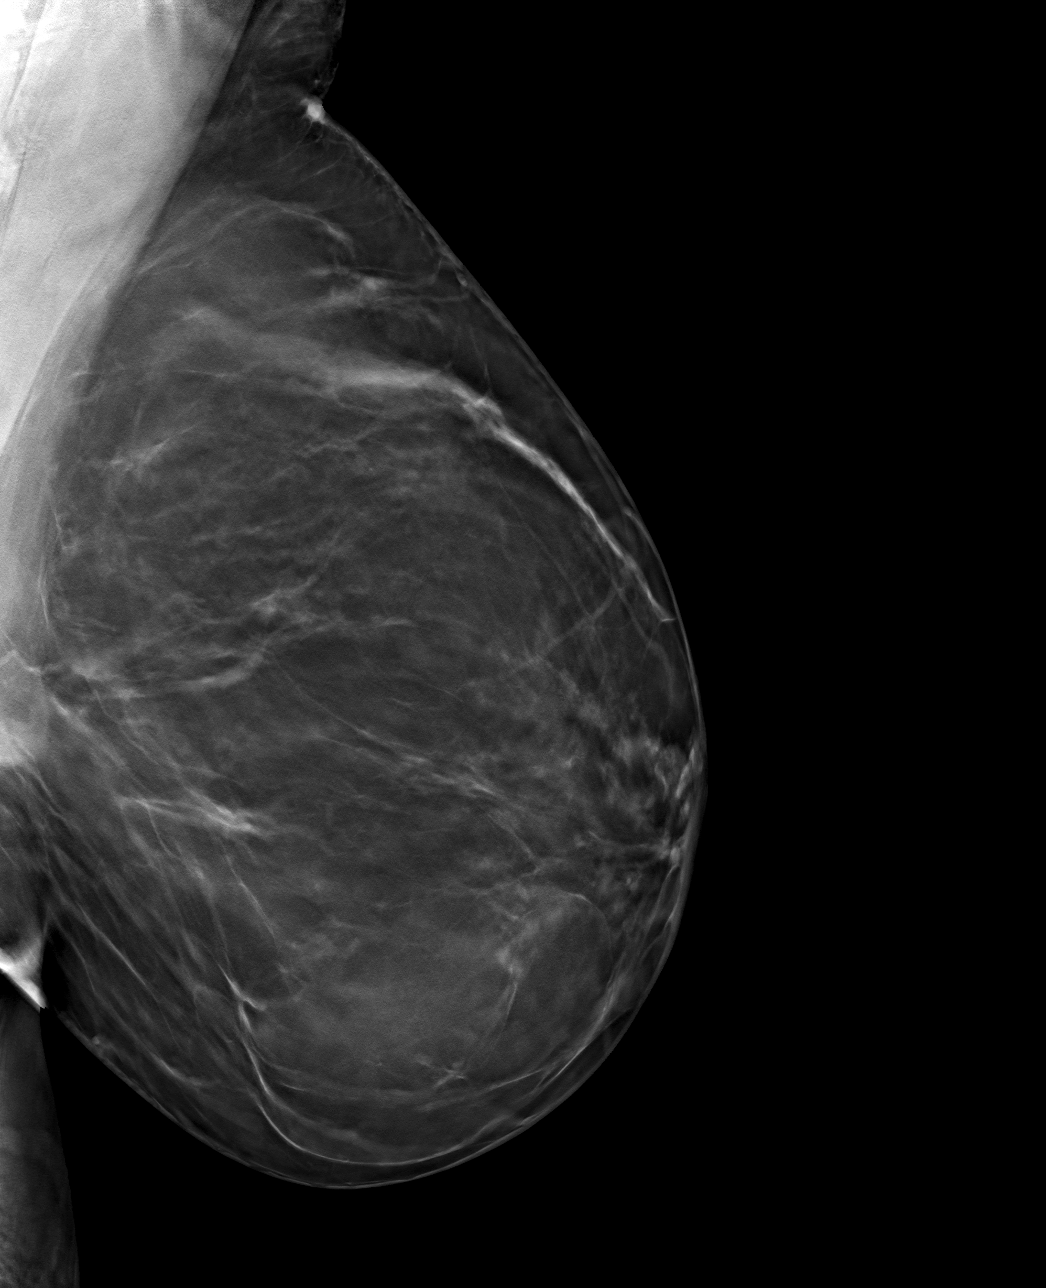

[4 of 12 positions shown; findings below may reference images not displayed]

ACR Breast Density Category b: There are scattered areas of
fibroglandular density.
FINDINGS: The tiny mass best seen on cc view in the medial aspect of the left
breast appears slightly decreased in size compared to the prior
mammogram. There are no new suspicious findings elsewhere in the
left breast.
IMPRESSION: Slight decrease in size of a probably benign mass in the medial
aspect of the left breast.

RECOMMENDATION:
Diagnostic bilateral mammogram in 6 months.

I have discussed the findings and recommendations with the patient.
If applicable, a reminder letter will be sent to the patient
regarding the next appointment.

BI-RADS CATEGORY  3: Probably benign.

## 2023-04-14 DIAGNOSIS — Z419 Encounter for procedure for purposes other than remedying health state, unspecified: Secondary | ICD-10-CM | POA: Diagnosis not present

## 2023-05-15 DIAGNOSIS — Z419 Encounter for procedure for purposes other than remedying health state, unspecified: Secondary | ICD-10-CM | POA: Diagnosis not present

## 2023-06-15 DIAGNOSIS — Z419 Encounter for procedure for purposes other than remedying health state, unspecified: Secondary | ICD-10-CM | POA: Diagnosis not present

## 2023-07-13 DIAGNOSIS — Z419 Encounter for procedure for purposes other than remedying health state, unspecified: Secondary | ICD-10-CM | POA: Diagnosis not present

## 2023-08-24 DIAGNOSIS — Z419 Encounter for procedure for purposes other than remedying health state, unspecified: Secondary | ICD-10-CM | POA: Diagnosis not present

## 2023-09-23 DIAGNOSIS — Z419 Encounter for procedure for purposes other than remedying health state, unspecified: Secondary | ICD-10-CM | POA: Diagnosis not present

## 2023-10-24 DIAGNOSIS — Z419 Encounter for procedure for purposes other than remedying health state, unspecified: Secondary | ICD-10-CM | POA: Diagnosis not present

## 2023-11-23 DIAGNOSIS — Z419 Encounter for procedure for purposes other than remedying health state, unspecified: Secondary | ICD-10-CM | POA: Diagnosis not present

## 2023-12-24 DIAGNOSIS — Z419 Encounter for procedure for purposes other than remedying health state, unspecified: Secondary | ICD-10-CM | POA: Diagnosis not present

## 2024-01-24 DIAGNOSIS — Z419 Encounter for procedure for purposes other than remedying health state, unspecified: Secondary | ICD-10-CM | POA: Diagnosis not present
